# Patient Record
Sex: Male | Born: 1960
Health system: Southern US, Community
[De-identification: ages and names within clinical notes are randomized; demographics above are authoritative.]

## PROBLEM LIST (undated history)

## (undated) DIAGNOSIS — E785 Hyperlipidemia, unspecified: Secondary | ICD-10-CM

## (undated) DIAGNOSIS — Z8719 Personal history of other diseases of the digestive system: Secondary | ICD-10-CM

## (undated) DIAGNOSIS — K5792 Diverticulitis of intestine, part unspecified, without perforation or abscess without bleeding: Secondary | ICD-10-CM

## (undated) DIAGNOSIS — Z9119 Patient's noncompliance with other medical treatment and regimen: Secondary | ICD-10-CM

## (undated) DIAGNOSIS — I10 Essential (primary) hypertension: Secondary | ICD-10-CM

## (undated) DIAGNOSIS — Z91199 Patient's noncompliance with other medical treatment and regimen due to unspecified reason: Secondary | ICD-10-CM

## (undated) HISTORY — DX: Patient's noncompliance with other medical treatment and regimen: Z91.19

## (undated) HISTORY — DX: Diverticulitis of intestine, part unspecified, without perforation or abscess without bleeding: K57.92

## (undated) HISTORY — DX: Hyperlipidemia, unspecified: E78.5

## (undated) HISTORY — PX: COLONOSCOPY: SHX174

## (undated) HISTORY — DX: Essential (primary) hypertension: I10

## (undated) HISTORY — PX: POLYPECTOMY: SHX149

## (undated) HISTORY — PX: HERNIA REPAIR: SHX51

## (undated) HISTORY — DX: Patient's noncompliance with other medical treatment and regimen due to unspecified reason: Z91.199

## (undated) HISTORY — PX: CHOLECYSTECTOMY: SHX55

## (undated) HISTORY — DX: Personal history of other diseases of the digestive system: Z87.19

---

## 2015-12-13 DIAGNOSIS — R1031 Right lower quadrant pain: Secondary | ICD-10-CM | POA: Diagnosis not present

## 2015-12-13 DIAGNOSIS — R1084 Generalized abdominal pain: Secondary | ICD-10-CM | POA: Diagnosis not present

## 2015-12-13 DIAGNOSIS — R1032 Left lower quadrant pain: Secondary | ICD-10-CM | POA: Diagnosis not present

## 2016-04-19 DIAGNOSIS — Z0000 Encounter for general adult medical examination without abnormal findings: Secondary | ICD-10-CM | POA: Diagnosis not present

## 2016-04-19 DIAGNOSIS — I10 Essential (primary) hypertension: Secondary | ICD-10-CM | POA: Diagnosis not present

## 2016-04-19 DIAGNOSIS — E663 Overweight: Secondary | ICD-10-CM | POA: Diagnosis not present

## 2016-04-19 DIAGNOSIS — E785 Hyperlipidemia, unspecified: Secondary | ICD-10-CM | POA: Diagnosis not present

## 2016-06-28 DIAGNOSIS — M545 Low back pain: Secondary | ICD-10-CM | POA: Diagnosis not present

## 2016-06-28 DIAGNOSIS — M9901 Segmental and somatic dysfunction of cervical region: Secondary | ICD-10-CM | POA: Diagnosis not present

## 2016-06-28 DIAGNOSIS — M530 Cervicocranial syndrome: Secondary | ICD-10-CM | POA: Diagnosis not present

## 2016-06-28 DIAGNOSIS — M9902 Segmental and somatic dysfunction of thoracic region: Secondary | ICD-10-CM | POA: Diagnosis not present

## 2016-08-16 DIAGNOSIS — H52223 Regular astigmatism, bilateral: Secondary | ICD-10-CM | POA: Diagnosis not present

## 2017-04-25 DIAGNOSIS — Z1339 Encounter for screening examination for other mental health and behavioral disorders: Secondary | ICD-10-CM | POA: Diagnosis not present

## 2017-04-25 DIAGNOSIS — Z6828 Body mass index (BMI) 28.0-28.9, adult: Secondary | ICD-10-CM | POA: Diagnosis not present

## 2017-04-25 DIAGNOSIS — Z125 Encounter for screening for malignant neoplasm of prostate: Secondary | ICD-10-CM | POA: Diagnosis not present

## 2017-04-25 DIAGNOSIS — Z1331 Encounter for screening for depression: Secondary | ICD-10-CM | POA: Diagnosis not present

## 2017-04-25 DIAGNOSIS — Z0000 Encounter for general adult medical examination without abnormal findings: Secondary | ICD-10-CM | POA: Diagnosis not present

## 2017-06-20 DIAGNOSIS — Z79899 Other long term (current) drug therapy: Secondary | ICD-10-CM | POA: Diagnosis not present

## 2017-08-22 DIAGNOSIS — J111 Influenza due to unidentified influenza virus with other respiratory manifestations: Secondary | ICD-10-CM | POA: Diagnosis not present

## 2017-09-17 DIAGNOSIS — M9901 Segmental and somatic dysfunction of cervical region: Secondary | ICD-10-CM | POA: Diagnosis not present

## 2017-09-17 DIAGNOSIS — M542 Cervicalgia: Secondary | ICD-10-CM | POA: Diagnosis not present

## 2017-09-17 DIAGNOSIS — M9903 Segmental and somatic dysfunction of lumbar region: Secondary | ICD-10-CM | POA: Diagnosis not present

## 2017-09-17 DIAGNOSIS — M545 Low back pain: Secondary | ICD-10-CM | POA: Diagnosis not present

## 2017-09-23 DIAGNOSIS — J029 Acute pharyngitis, unspecified: Secondary | ICD-10-CM | POA: Diagnosis not present

## 2018-04-06 DIAGNOSIS — M9903 Segmental and somatic dysfunction of lumbar region: Secondary | ICD-10-CM | POA: Diagnosis not present

## 2018-04-06 DIAGNOSIS — M542 Cervicalgia: Secondary | ICD-10-CM | POA: Diagnosis not present

## 2018-04-06 DIAGNOSIS — M9901 Segmental and somatic dysfunction of cervical region: Secondary | ICD-10-CM | POA: Diagnosis not present

## 2018-04-06 DIAGNOSIS — M545 Low back pain: Secondary | ICD-10-CM | POA: Diagnosis not present

## 2018-04-10 DIAGNOSIS — M9903 Segmental and somatic dysfunction of lumbar region: Secondary | ICD-10-CM | POA: Diagnosis not present

## 2018-04-10 DIAGNOSIS — M545 Low back pain: Secondary | ICD-10-CM | POA: Diagnosis not present

## 2018-04-10 DIAGNOSIS — M9901 Segmental and somatic dysfunction of cervical region: Secondary | ICD-10-CM | POA: Diagnosis not present

## 2018-04-10 DIAGNOSIS — M542 Cervicalgia: Secondary | ICD-10-CM | POA: Diagnosis not present

## 2018-04-13 DIAGNOSIS — M9901 Segmental and somatic dysfunction of cervical region: Secondary | ICD-10-CM | POA: Diagnosis not present

## 2018-04-13 DIAGNOSIS — M542 Cervicalgia: Secondary | ICD-10-CM | POA: Diagnosis not present

## 2018-04-13 DIAGNOSIS — M545 Low back pain: Secondary | ICD-10-CM | POA: Diagnosis not present

## 2018-04-13 DIAGNOSIS — M9903 Segmental and somatic dysfunction of lumbar region: Secondary | ICD-10-CM | POA: Diagnosis not present

## 2018-04-15 DIAGNOSIS — M9901 Segmental and somatic dysfunction of cervical region: Secondary | ICD-10-CM | POA: Diagnosis not present

## 2018-04-15 DIAGNOSIS — M9903 Segmental and somatic dysfunction of lumbar region: Secondary | ICD-10-CM | POA: Diagnosis not present

## 2018-04-15 DIAGNOSIS — M545 Low back pain: Secondary | ICD-10-CM | POA: Diagnosis not present

## 2018-04-15 DIAGNOSIS — M542 Cervicalgia: Secondary | ICD-10-CM | POA: Diagnosis not present

## 2018-04-24 DIAGNOSIS — K5792 Diverticulitis of intestine, part unspecified, without perforation or abscess without bleeding: Secondary | ICD-10-CM | POA: Diagnosis not present

## 2018-04-24 DIAGNOSIS — Z1211 Encounter for screening for malignant neoplasm of colon: Secondary | ICD-10-CM | POA: Diagnosis not present

## 2018-04-24 DIAGNOSIS — Z13220 Encounter for screening for lipoid disorders: Secondary | ICD-10-CM | POA: Diagnosis not present

## 2018-04-24 DIAGNOSIS — Z0000 Encounter for general adult medical examination without abnormal findings: Secondary | ICD-10-CM | POA: Diagnosis not present

## 2018-04-24 DIAGNOSIS — Z6829 Body mass index (BMI) 29.0-29.9, adult: Secondary | ICD-10-CM | POA: Diagnosis not present

## 2018-05-12 DIAGNOSIS — J029 Acute pharyngitis, unspecified: Secondary | ICD-10-CM | POA: Diagnosis not present

## 2018-05-12 DIAGNOSIS — R1032 Left lower quadrant pain: Secondary | ICD-10-CM | POA: Diagnosis not present

## 2018-05-12 DIAGNOSIS — K5732 Diverticulitis of large intestine without perforation or abscess without bleeding: Secondary | ICD-10-CM | POA: Diagnosis not present

## 2018-05-12 DIAGNOSIS — R112 Nausea with vomiting, unspecified: Secondary | ICD-10-CM | POA: Diagnosis not present

## 2018-05-12 DIAGNOSIS — R6889 Other general symptoms and signs: Secondary | ICD-10-CM | POA: Diagnosis not present

## 2018-06-02 ENCOUNTER — Encounter: Payer: Self-pay | Admitting: Gastroenterology

## 2018-06-24 ENCOUNTER — Telehealth: Payer: Self-pay

## 2018-06-24 ENCOUNTER — Ambulatory Visit (AMBULATORY_SURGERY_CENTER): Payer: Self-pay | Admitting: *Deleted

## 2018-06-24 VITALS — Ht 72.0 in | Wt 217.0 lb

## 2018-06-24 DIAGNOSIS — Z86010 Personal history of colonic polyps: Secondary | ICD-10-CM

## 2018-06-24 MED ORDER — SOD PICOSULFATE-MAG OX-CIT ACD 10-3.5-12 MG-GM -GM/160ML PO SOLN: 1.0000 | ORAL | 0 refills | Status: DC

## 2018-06-24 NOTE — Progress Notes (Signed)
Patient denies any allergies to eggs or soy. Patient denies any problems with anesthesia/sedation. Patient denies any oxygen use at home. Patient denies taking any diet/weight loss medications or blood thinners. EMMI education offered, pt declined. Clenpiq free coupon given to pt. Pt denies any kidney problems.

## 2018-06-24 NOTE — Telephone Encounter (Signed)
-----   Message from Mohammed Kindle, RN sent at 06/24/2018  3:55 PM EST ----- Regarding: FW: Donald Frank Please set this patient up with a pre visit and a procedure date/time; ----- Message ----- From: Jackquline Denmark, MD Sent: 06/24/2018  12:17 PM EST To: Donald Peace, RN, Mohammed Kindle, RN Subject: RE: Donald Frank                           Hi Donald Frank  Have reviewed the colonoscopy report-: 03/08/2013-1.2 cm polyp, Two 6 mm polyps. Bx-tubular adenomas.  Was due for colonoscopy 02/2016.  Was sent letter.   Plan: -Proceed with colonoscopy with 2-day preparation. Bre to set him up.  Merrie Roof ----- Message ----- From: Donald Peace, RN Sent: 05/29/2018   1:13 PM EST To: Jackquline Denmark, MD Subject: Donald Frank                               Dr. Lyndel Safe,  I am friends with Donald Frank and his wife.  He is your patient.  He has had several colonoscopies with you d/t recurrent diverticulitis.  His DOB is 05-30-2060.  His wife reached out to me and said he is due for another colonoscopy.  He had a recent diverticulitis attack.  Would you like an OV with him or just a direct colonoscopy?  Thanks, [employer]

## 2018-06-24 NOTE — Telephone Encounter (Signed)
Left message for patient to call back to office ?

## 2018-06-25 ENCOUNTER — Encounter: Payer: Self-pay | Admitting: Gastroenterology

## 2018-06-26 NOTE — Telephone Encounter (Signed)
Patient has been seen in pre visit on 06/24/2018 appt at 3:30pm; patient has also been scheduled on 06/29/2018 appt at 9:30am for prop colon;

## 2018-06-29 ENCOUNTER — Encounter: Payer: Self-pay | Admitting: Gastroenterology

## 2018-06-29 ENCOUNTER — Ambulatory Visit (AMBULATORY_SURGERY_CENTER): Payer: BLUE CROSS/BLUE SHIELD | Admitting: Gastroenterology

## 2018-06-29 ENCOUNTER — Other Ambulatory Visit: Payer: Self-pay

## 2018-06-29 VITALS — BP 117/68 | HR 56 | Temp 99.3°F | Resp 19 | Ht 72.0 in | Wt 217.0 lb

## 2018-06-29 DIAGNOSIS — D124 Benign neoplasm of descending colon: Secondary | ICD-10-CM | POA: Diagnosis not present

## 2018-06-29 DIAGNOSIS — Z860100 Personal history of colon polyps, unspecified: Secondary | ICD-10-CM

## 2018-06-29 DIAGNOSIS — D123 Benign neoplasm of transverse colon: Secondary | ICD-10-CM

## 2018-06-29 MED ORDER — SODIUM CHLORIDE 0.9 % IV SOLN: 500.0000 mL | Freq: Once | INTRAVENOUS | Status: DC

## 2018-06-29 NOTE — Progress Notes (Signed)
Called to room to assist during endoscopic procedure.  Patient ID and intended procedure confirmed with present staff. Received instructions for my participation in the procedure from the performing physician.  

## 2018-06-29 NOTE — Patient Instructions (Signed)
Handouts given on polyps, diverticulosis and hemorrhoids.  Also high fiber diet. Absolutely no driving today. Start with light meal such as eggs, grits, toast, pancakes/waffles, or lean meat.    YOU HAD AN ENDOSCOPIC PROCEDURE TODAY AT [facility]:   Refer to the procedure report that was given to you for any specific questions about what was found during the examination.  If the procedure report does not answer your questions, please call your gastroenterologist to clarify.  If you requested that your care partner not be given the details of your procedure findings, then the procedure report has been included in a sealed envelope for you to review at your convenience later.  YOU SHOULD EXPECT: Some feelings of bloating in the abdomen. Passage of more gas than usual.  Walking can help get rid of the air that was put into your GI tract during the procedure and reduce the bloating. If you had a lower endoscopy (such as a colonoscopy or flexible sigmoidoscopy) you may notice spotting of blood in your stool or on the toilet paper. If you underwent a bowel prep for your procedure, you may not have a normal bowel movement for a few days.  Please Note:  You might notice some irritation and congestion in your nose or some drainage.  This is from the oxygen used during your procedure.  There is no need for concern and it should clear up in a day or so.  SYMPTOMS TO REPORT IMMEDIATELY:   Following lower endoscopy (colonoscopy or flexible sigmoidoscopy):  Excessive amounts of blood in the stool  Significant tenderness or worsening of abdominal pains  Swelling of the abdomen that is new, acute  Fever of 100F or higher   For urgent or emergent issues, a gastroenterologist can be reached at any hour by calling [phone].   DIET:  We do recommend a small meal at first, but then you may proceed to your regular diet.  Drink plenty of fluids but you should avoid alcoholic beverages for  24 hours.  ACTIVITY:  You should plan to take it easy for the rest of today and you should NOT DRIVE or use heavy machinery until tomorrow (because of the sedation medicines used during the test).    FOLLOW UP: Our staff will call the number listed on your records the next business day following your procedure to check on you and address any questions or concerns that you may have regarding the information given to you following your procedure. If we do not reach you, we will leave a message.  However, if you are feeling well and you are not experiencing any problems, there is no need to return our call.  We will assume that you have returned to your regular daily activities without incident.  If any biopsies were taken you will be contacted by phone or by letter within the next 1-3 weeks.  Please call us at [phone] if you have not heard about the biopsies in 3 weeks.    SIGNATURES/CONFIDENTIALITY: You and/or your care partner have signed paperwork which will be entered into your electronic medical record.  These signatures attest to the fact that that the information above on your After Visit Summary has been reviewed and is understood.  Full responsibility of the confidentiality of this discharge information lies with you and/or your care-partner.

## 2018-06-29 NOTE — Op Note (Signed)
[place] Patient Name: Donald Frank Procedure Date: 06/29/2018 9:24 AM MRN: [id number] Endoscopist: Jackquline Denmark , MD Age: 58 Referring MD:  Date of Birth: 1961/02/20 Gender: Male Account #: [id number] Procedure:                Colonoscopy Indications:              High risk colon cancer surveillance: Personal                            history of colonic polyps Medicines:                Monitored Anesthesia Care Procedure:                Pre-Anesthesia Assessment:                           - Prior to the procedure, a History and Physical                            was performed, and patient medications and                            allergies were reviewed. The patient's tolerance of                            previous anesthesia was also reviewed. The risks                            and benefits of the procedure and the sedation                            options and risks were discussed with the patient.                            All questions were answered, and informed consent                            was obtained. Prior Anticoagulants: The patient has                            taken no previous anticoagulant or antiplatelet                            agents. ASA Grade Assessment: II - A patient with                            mild systemic disease. After reviewing the risks                            and benefits, the patient was deemed in                            satisfactory condition to undergo the procedure.  After obtaining informed consent, the colonoscope                            was passed under direct vision. Throughout the                            procedure, the patient's blood pressure, pulse, and                            oxygen saturations were monitored continuously. The                            Colonoscope was introduced through the anus and                            advanced to the 2 cm into the ileum. The                             colonoscopy was performed without difficulty. The                            patient tolerated the procedure well. The quality                            of the bowel preparation was excellent. The                            terminal ileum, ileocecal valve, appendiceal                            orifice, and rectum were photographed. Scope In: 9:29:40 AM Scope Out: 9:47:02 AM Scope Withdrawal Time: 0 hours 11 minutes 51 seconds  Total Procedure Duration: 0 hours 17 minutes 22 seconds  Findings:                 Two sessile polyps were found in the proximal                            descending colon and mid transverse colon. The                            polyps were 2 to 6 mm in size. These polyps were                            removed with a cold snare. Resection and retrieval                            were complete. Estimated blood loss: none.                           Multiple small-mouthed diverticula were found in                            the sigmoid colon, descending colon  and few                            scattered diverticula in ascending colon and                            transverse colon.                           Non-bleeding internal hemorrhoids were found during                            retroflexion. The hemorrhoids were small.                           The exam was otherwise without abnormality on                            direct and retroflexion views.                           The terminal ileum appeared normal. Complications:            No immediate complications. Estimated Blood Loss:     Estimated blood loss: none. Impression:               -Colonic polyps status post polypectomy.                           -Pancolonic diverticulosis predominantly in the                            left colon.                           -Otherwise normal colonoscopy to TI. Recommendation:           - Patient has a contact number available for                             emergencies. The signs and symptoms of potential                            delayed complications were discussed with the                            patient. Return to normal activities tomorrow.                            Written discharge instructions were provided to the                            patient.                           - High fiber diet.                           - Continue  present medications.                           - Await pathology results.                           - Repeat colonoscopy for surveillance based on                            pathology results.                           - Return to GI office PRN. Jackquline Denmark, MD 06/29/2018 9:54:00 AM This report has been signed electronically.

## 2018-06-29 NOTE — Progress Notes (Signed)
PT taken to PACU. Monitors in place. VSS. Report given to RN. 

## 2018-06-30 ENCOUNTER — Telehealth: Payer: Self-pay | Admitting: *Deleted

## 2018-06-30 ENCOUNTER — Telehealth: Payer: Self-pay

## 2018-06-30 NOTE — Telephone Encounter (Signed)
  Follow up Call-  Call back number 06/29/2018  Post procedure Call Back phone  # [phone]  Permission to leave phone message Yes     Patient questions:  Left message to call us if necessary.  Second call.

## 2018-06-30 NOTE — Telephone Encounter (Signed)
Follow up call made, name identifier left a message.

## 2018-07-05 ENCOUNTER — Encounter: Payer: Self-pay | Admitting: Gastroenterology

## 2019-04-08 DIAGNOSIS — H524 Presbyopia: Secondary | ICD-10-CM | POA: Diagnosis not present

## 2019-04-28 DIAGNOSIS — Z13220 Encounter for screening for lipoid disorders: Secondary | ICD-10-CM | POA: Diagnosis not present

## 2019-04-28 DIAGNOSIS — Z0000 Encounter for general adult medical examination without abnormal findings: Secondary | ICD-10-CM | POA: Diagnosis not present

## 2019-04-28 DIAGNOSIS — Z131 Encounter for screening for diabetes mellitus: Secondary | ICD-10-CM | POA: Diagnosis not present

## 2019-04-28 DIAGNOSIS — Z125 Encounter for screening for malignant neoplasm of prostate: Secondary | ICD-10-CM | POA: Diagnosis not present

## 2019-04-28 DIAGNOSIS — G4700 Insomnia, unspecified: Secondary | ICD-10-CM | POA: Diagnosis not present

## 2019-04-28 DIAGNOSIS — Z6829 Body mass index (BMI) 29.0-29.9, adult: Secondary | ICD-10-CM | POA: Diagnosis not present

## 2019-04-28 DIAGNOSIS — Z1331 Encounter for screening for depression: Secondary | ICD-10-CM | POA: Diagnosis not present

## 2019-07-01 DIAGNOSIS — I10 Essential (primary) hypertension: Secondary | ICD-10-CM | POA: Diagnosis not present

## 2019-07-01 DIAGNOSIS — G4700 Insomnia, unspecified: Secondary | ICD-10-CM | POA: Diagnosis not present

## 2019-07-01 DIAGNOSIS — E782 Mixed hyperlipidemia: Secondary | ICD-10-CM | POA: Diagnosis not present

## 2019-07-01 DIAGNOSIS — R400 Somnolence: Secondary | ICD-10-CM | POA: Diagnosis not present

## 2019-07-23 DIAGNOSIS — G4733 Obstructive sleep apnea (adult) (pediatric): Secondary | ICD-10-CM | POA: Diagnosis not present

## 2019-07-23 DIAGNOSIS — R5383 Other fatigue: Secondary | ICD-10-CM | POA: Diagnosis not present

## 2019-07-23 DIAGNOSIS — R911 Solitary pulmonary nodule: Secondary | ICD-10-CM | POA: Diagnosis not present

## 2019-07-31 DIAGNOSIS — M10072 Idiopathic gout, left ankle and foot: Secondary | ICD-10-CM | POA: Diagnosis not present

## 2019-08-18 DIAGNOSIS — G4733 Obstructive sleep apnea (adult) (pediatric): Secondary | ICD-10-CM | POA: Diagnosis not present

## 2020-05-04 DIAGNOSIS — K5792 Diverticulitis of intestine, part unspecified, without perforation or abscess without bleeding: Secondary | ICD-10-CM | POA: Insufficient documentation

## 2020-05-04 DIAGNOSIS — E785 Hyperlipidemia, unspecified: Secondary | ICD-10-CM | POA: Insufficient documentation

## 2020-05-04 DIAGNOSIS — Z8719 Personal history of other diseases of the digestive system: Secondary | ICD-10-CM | POA: Insufficient documentation

## 2020-05-04 DIAGNOSIS — Z9119 Patient's noncompliance with other medical treatment and regimen: Secondary | ICD-10-CM | POA: Insufficient documentation

## 2020-05-04 DIAGNOSIS — I10 Essential (primary) hypertension: Secondary | ICD-10-CM | POA: Insufficient documentation

## 2020-05-04 DIAGNOSIS — Z91199 Patient's noncompliance with other medical treatment and regimen due to unspecified reason: Secondary | ICD-10-CM | POA: Insufficient documentation

## 2020-05-08 ENCOUNTER — Ambulatory Visit (INDEPENDENT_AMBULATORY_CARE_PROVIDER_SITE_OTHER): Payer: 59 | Admitting: Cardiology

## 2020-05-08 ENCOUNTER — Encounter: Payer: Self-pay | Admitting: Cardiology

## 2020-05-08 ENCOUNTER — Other Ambulatory Visit: Payer: Self-pay

## 2020-05-08 VITALS — BP 120/80 | HR 59 | Ht 72.0 in | Wt 220.4 lb

## 2020-05-08 DIAGNOSIS — E781 Pure hyperglyceridemia: Secondary | ICD-10-CM

## 2020-05-08 DIAGNOSIS — E7801 Familial hypercholesterolemia: Secondary | ICD-10-CM

## 2020-05-08 DIAGNOSIS — I10 Essential (primary) hypertension: Secondary | ICD-10-CM | POA: Diagnosis not present

## 2020-05-08 DIAGNOSIS — I2510 Atherosclerotic heart disease of native coronary artery without angina pectoris: Secondary | ICD-10-CM | POA: Diagnosis not present

## 2020-05-08 MED ORDER — VASCEPA 0.5 G PO CAPS: 0.5000 g | ORAL_CAPSULE | Freq: Every day | ORAL | 3 refills | Status: DC

## 2020-05-08 MED ORDER — ROSUVASTATIN CALCIUM 40 MG PO TABS: 40.0000 mg | ORAL_TABLET | Freq: Every day | ORAL | 3 refills | Status: DC

## 2020-05-08 NOTE — Progress Notes (Signed)
Cardiology Office Note:    Date:  05/09/2020   ID:  Donald Frank, DOB 1960-07-08, MRN [id number]  PCP:  Angelina Sheriff, MD  Cardiologist:  Berniece Salines, DO  Electrophysiologist:  None   Referring MD: Darien Ramus, MD   " I am doing well"  History of Present Illness:    Donald Frank is a 60 y.o. male with a hx of dyslipidemia, hypertension is here today to be evaluated for familial hyperlipidemia.  The patient follows with Dr. Jeryl Columbia and his recent lipid profile which was done April 25, 2020 showed significantly elevated triglycerides 864, total cholesterol 357, LDL 55 and HDL 25.  He was asked to see cardiology for help with management of his hyperlipidemia.  He denies any chest pain, shortness of breath, lightheadedness, dizziness  Past Medical History:  Diagnosis Date  . Diverticulitis 05/12/2018=treatment done  . Dyslipidemia   . History of pancreatitis   . Hyperlipidemia   . Hypertension   . Noncompliance     Past Surgical History:  Procedure Laterality Date  . CHOLECYSTECTOMY    . COLONOSCOPY  last 02/23/2009  . HERNIA REPAIR    . POLYPECTOMY      Current Medications: Current Meds  Medication Sig  . Icosapent Ethyl (VASCEPA) 0.5 g CAPS Take 0.5 g by mouth daily.  Marland Kitchen lisinopril (PRINIVIL,ZESTRIL) 5 MG tablet   . rosuvastatin (CRESTOR) 40 MG tablet Take 1 tablet (40 mg total) by mouth daily.  . [DISCONTINUED] fenofibrate (TRICOR) 145 MG tablet   . [DISCONTINUED] rosuvastatin (CRESTOR) 20 MG tablet      Allergies:   Lipitor [atorvastatin]   Social History   Socioeconomic History  . Marital status: Married    Spouse name: Not on file  . Number of children: Not on file  . Years of education: Not on file  . Highest education level: Not on file  Occupational History  . Not on file  Tobacco Use  . Smoking status: Never Smoker  . Smokeless tobacco: Never Used  Vaping Use  . Vaping Use: Never used  Substance and Sexual Activity  .  Alcohol use: Yes    Alcohol/week: 3.0 standard drinks    Types: 3 Standard drinks or equivalent per week    Comment: per week  . Drug use: Not Currently  . Sexual activity: Not on file  Other Topics Concern  . Not on file  Social History Narrative  . Not on file   Social Determinants of Health   Financial Resource Strain: Not on file  Food Insecurity: Not on file  Transportation Needs: Not on file  Physical Activity: Not on file  Stress: Not on file  Social Connections: Not on file     Family History: The patient's family history includes Heart Problems in his mother. There is no history of Colon cancer, Colon polyps, Esophageal cancer, Rectal cancer, or Stomach cancer.  ROS:   Review of Systems  Constitution: Negative for decreased appetite, fever and weight gain.  HENT: Negative for congestion, ear discharge, hoarse voice and sore throat.   Eyes: Negative for discharge, redness, vision loss in right eye and visual halos.  Cardiovascular: Negative for chest pain, dyspnea on exertion, leg swelling, orthopnea and palpitations.  Respiratory: Negative for cough, hemoptysis, shortness of breath and snoring.   Endocrine: Negative for heat intolerance and polyphagia.  Hematologic/Lymphatic: Negative for bleeding problem. Does not bruise/bleed easily.  Skin: Negative for flushing, nail changes, rash and suspicious lesions.  Musculoskeletal:  Negative for arthritis, joint pain, muscle cramps, myalgias, neck pain and stiffness.  Gastrointestinal: Negative for abdominal pain, bowel incontinence, diarrhea and excessive appetite.  Genitourinary: Negative for decreased libido, genital sores and incomplete emptying.  Neurological: Negative for brief paralysis, focal weakness, headaches and loss of balance.  Psychiatric/Behavioral: Negative for altered mental status, depression and suicidal ideas.  Allergic/Immunologic: Negative for HIV exposure and persistent infections.    EKGs/Labs/Other  Studies Reviewed:    The following studies were reviewed today:   EKG:  The ekg ordered today demonstrates sinus rhythm, heart rate 60 bpm.  Recent Labs: No results found for requested labs within last 8760 hours.  Recent Lipid Panel No results found for: CHOL, TRIG, HDL, CHOLHDL, VLDL, LDLCALC, LDLDIRECT  Physical Exam:    VS:  BP 120/80 (BP Location: Left Arm)   Pulse (!) 59   Ht 6' (1.829 m)   Wt 220 lb 6.4 oz (100 kg)   SpO2 98%   BMI 29.89 kg/m     Wt Readings from Last 3 Encounters:  05/08/20 220 lb 6.4 oz (100 kg)  06/29/18 217 lb (98.4 kg)  06/24/18 217 lb (98.4 kg)     GEN: Well nourished, well developed in no acute distress HEENT: Normal NECK: No JVD; No carotid bruits LYMPHATICS: No lymphadenopathy CARDIAC: S1S2 noted,RRR, no murmurs, rubs, gallops RESPIRATORY:  Clear to auscultation without rales, wheezing or rhonchi  ABDOMEN: Soft, non-tender, non-distended, +bowel sounds, no guarding. EXTREMITIES: No edema, No cyanosis, no clubbing MUSCULOSKELETAL:  No deformity  SKIN: Warm and dry NEUROLOGIC:  Alert and oriented x 3, non-focal PSYCHIATRIC:  Normal affect, good insight  ASSESSMENT:    1. Hypertension, unspecified type   2. ASCVD (arteriosclerotic cardiovascular disease)   3. Familial hypercholesterolemia   4. Hypertriglyceridemia, familial    PLAN:     He is currently on Crestor 20 mg daily along with fenofibrate 145 mg.  I reviewed his lipid profile triglycerides 864 would like to do is stop the fenofibrate and put the patient on Vascepa starting with 500 mg daily.  Also increase his Crestor to 40 mg daily.  LP(a) today.  He may be a great candidate for PCSK9 inhibitors but what I like to do first is repeat his lipid profile in 6 weeks after the change and if this is still elevated I recommend him to our lipid clinic to start the process for his PCSK9 inhibitors.  In the meantime we have calculated his ASCVD risk score and at this time would like to  have the patient undergo coronary artery calcium scoring.  We talked about the testings.  All of his questions has been answered.  He is agreeable to proceed with this.  Prior to his next visit he will get a lipoprotein a test, lipid panel as well as hepatic function testing.  In addition we also talked about diet modification which will help as well.   The patient is in agreement with the above plan. The patient left the office in stable condition.  The patient will follow up in 7 weeks or sooner if needed.   Medication Adjustments/Labs and Tests Ordered: Current medicines are reviewed at length with the patient today.  Concerns regarding medicines are outlined above.  Orders Placed This Encounter  Procedures  . CT CARDIAC SCORING (SELF PAY ONLY)  . Lipoprotein A (LPA)  . Lipid panel  . Hepatic function panel  . EKG 12-Lead   Meds ordered this encounter  Medications  . Icosapent Ethyl (  VASCEPA) 0.5 g CAPS    Sig: Take 0.5 g by mouth daily.    Dispense:  90 capsule    Refill:  3  . rosuvastatin (CRESTOR) 40 MG tablet    Sig: Take 1 tablet (40 mg total) by mouth daily.    Dispense:  90 tablet    Refill:  3    Patient Instructions  Medication Instructions:  Your physician has recommended you make the following change in your medication:  STOP: Fenofibrate START: Crestor 40 mg take one tablet by mouth daily.  START: Vascepa 0.5 g take one tablet by mouth daily.  *If you need a refill on your cardiac medications before your next appointment, please call your pharmacy*   Lab Work: Your physician recommends that you return for lab work in: TODAY: Lpa  In 6 weeks: LFT'S, Lipids If you have labs (blood work) drawn today and your tests are completely normal, you will receive your results only by: Marland Kitchen MyChart Message (if you have MyChart) OR . A paper copy in the mail If you have any lab test that is abnormal or we need to change your treatment, we will call you to review the  results.   Testing/Procedures: We have put in the order for you to have a CT calcium score completed. They will call you to schedule this appointment.    Follow-Up: At [facility], you and your health needs are our priority.  As part of our continuing mission to provide you with exceptional heart care, we have created designated Provider Care Teams.  These Care Teams include your primary Cardiologist (physician) and Advanced Practice Providers (APPs -  Physician Assistants and Nurse Practitioners) who all work together to provide you with the care you need, when you need it.  We recommend signing up for the patient portal called "MyChart".  Sign up information is provided on this After Visit Summary.  MyChart is used to connect with patients for Virtual Visits (Telemedicine).  Patients are able to view lab/test results, encounter notes, upcoming appointments, etc.  Non-urgent messages can be sent to your provider as well.   To learn more about what you can do with MyChart, go to [email or web address].    Your next appointment:   7 week(s)  The format for your next appointment:   In Person  Provider:   Berniece Salines, DO   Other Instructions      Adopting a Healthy Lifestyle.  Know what a healthy weight is for you (roughly BMI <25) and aim to maintain this   Aim for 7+ servings of fruits and vegetables daily   65-80+ fluid ounces of water or unsweet tea for healthy kidneys   Limit to max 1 drink of alcohol per day; avoid smoking/tobacco   Limit animal fats in diet for cholesterol and heart health - choose grass fed whenever available   Avoid highly processed foods, and foods high in saturated/trans fats   Aim for low stress - take time to unwind and care for your mental health   Aim for 150 min of moderate intensity exercise weekly for heart health, and weights twice weekly for bone health   Aim for 7-9 hours of sleep daily   When it comes to diets, agreement about  the perfect plan isnt easy to find, even among the experts. Experts at the [employer] developed an idea known as the Healthy Eating Plate. Just imagine a plate divided into logical, healthy portions.   The  emphasis is on diet quality:   Load up on vegetables and fruits - one-half of your plate: Aim for color and variety, and remember that potatoes dont count.   Go for whole grains - one-quarter of your plate: Whole wheat, barley, wheat berries, quinoa, oats, brown rice, and foods made with them. If you want pasta, go with whole wheat pasta.   Protein power - one-quarter of your plate: Fish, chicken, beans, and nuts are all healthy, versatile protein sources. Limit red meat.   The diet, however, does go beyond the plate, offering a few other suggestions.   Use healthy plant oils, such as olive, canola, soy, corn, sunflower and peanut. Check the labels, and avoid partially hydrogenated oil, which have unhealthy trans fats.   If youre thirsty, drink water. Coffee and tea are good in moderation, but skip sugary drinks and limit milk and dairy products to one or two daily servings.   The type of carbohydrate in the diet is more important than the amount. Some sources of carbohydrates, such as vegetables, fruits, whole grains, and beans-are healthier than others.   Finally, stay active  Signed, Berniece Salines, DO  05/09/2020 8:14 AM    [place]

## 2020-05-08 NOTE — Patient Instructions (Signed)
Medication Instructions:  Your physician has recommended you make the following change in your medication:  STOP: Fenofibrate START: Crestor 40 mg take one tablet by mouth daily.  START: Vascepa 0.5 g take one tablet by mouth daily.  *If you need a refill on your cardiac medications before your next appointment, please call your pharmacy*   Lab Work: Your physician recommends that you return for lab work in: TODAY: Lpa  In 6 weeks: LFT'S, Lipids If you have labs (blood work) drawn today and your tests are completely normal, you will receive your results only by: Marland Kitchen MyChart Message (if you have MyChart) OR . A paper copy in the mail If you have any lab test that is abnormal or we need to change your treatment, we will call you to review the results.   Testing/Procedures: We have put in the order for you to have a CT calcium score completed. They will call you to schedule this appointment.    Follow-Up: At [facility], you and your health needs are our priority.  As part of our continuing mission to provide you with exceptional heart care, we have created designated Provider Care Teams.  These Care Teams include your primary Cardiologist (physician) and Advanced Practice Providers (APPs -  Physician Assistants and Nurse Practitioners) who all work together to provide you with the care you need, when you need it.  We recommend signing up for the patient portal called "MyChart".  Sign up information is provided on this After Visit Summary.  MyChart is used to connect with patients for Virtual Visits (Telemedicine).  Patients are able to view lab/test results, encounter notes, upcoming appointments, etc.  Non-urgent messages can be sent to your provider as well.   To learn more about what you can do with MyChart, go to [email or web address].    Your next appointment:   7 week(s)  The format for your next appointment:   In Person  Provider:   Berniece Salines, DO   Other  Instructions

## 2020-05-09 ENCOUNTER — Telehealth: Payer: Self-pay

## 2020-05-09 DIAGNOSIS — E785 Hyperlipidemia, unspecified: Secondary | ICD-10-CM

## 2020-05-09 LAB — LIPOPROTEIN A (LPA): Lipoprotein (a): 224.9 nmol/L — ABNORMAL HIGH (ref <75.0)

## 2020-05-09 NOTE — Telephone Encounter (Signed)
-----   Message from Berniece Salines, DO sent at 05/09/2020  3:04 PM EST ----- Let the patient know that his Lpa is elevated. I would like to send the referral for the lipid clinic evaluation for PCSK9 inhibitor.

## 2020-05-09 NOTE — Telephone Encounter (Signed)
Spoke with patient regarding results and recommendation.  Patient verbalizes understanding and is agreeable to plan of care. Advised patient to call back with any issues or concerns.  

## 2020-05-22 ENCOUNTER — Encounter: Payer: Self-pay | Admitting: Pharmacist

## 2020-05-22 ENCOUNTER — Other Ambulatory Visit: Payer: Self-pay

## 2020-05-22 ENCOUNTER — Ambulatory Visit (INDEPENDENT_AMBULATORY_CARE_PROVIDER_SITE_OTHER): Payer: 59 | Admitting: Pharmacist

## 2020-05-22 DIAGNOSIS — E782 Mixed hyperlipidemia: Secondary | ICD-10-CM | POA: Diagnosis not present

## 2020-05-22 DIAGNOSIS — E7801 Familial hypercholesterolemia: Secondary | ICD-10-CM

## 2020-05-22 MED ORDER — VASCEPA 0.5 G PO CAPS: ORAL_CAPSULE | ORAL | 3 refills | Status: DC

## 2020-05-22 MED ORDER — EVOLOCUMAB 140 MG/ML ~~LOC~~ SOAJ: 1.0000 mL | SUBCUTANEOUS | 1 refills | Status: DC

## 2020-05-22 NOTE — Patient Instructions (Addendum)
It was nice meeting you today!  We would like your LDL (bad cholesterol) to be less than 70 We would like your triglycerides to be less than 150  Please continue your rosuvastatin 40mg  once daily  Increase your Vascepa to 4 capsules twice daily  We are going to start a new medication called Repatha which you will inject once every 2 weeks  Try to increase your physical activity to at least 30 minutes a day at least 5 days a week  Try to concentrate on eating vegetables, lean proteins, whole grains, and healthy fats  Avoid trans fats and partially hydrogenated oils  Please call with any questions!  Karren Cobble, PharmD, BCACP, [place], [place] [address] [place], [place] [address] Phone: [phone]; Fax: [phone] 05/22/2020 4:09 PM

## 2020-05-22 NOTE — Progress Notes (Signed)
Patient ID: Donald Frank                 DOB: [birth year]                    MRN: [id number]     HPI: Donald Frank is a 60 y.o. male patient referred to lipid clinic by Dr Harriet Masson. PMH is significant for HTN, HLD, and a history of pancreatitis ten years ago.  Referred by PCP to [facility] after lipid panel came back elevated.  LDL could not be calculated due to triglycerides.  At visit with Dr. Harriet Masson on 05/09/20, fenofibrate was discontinued and Vascepa 0.5g was initiated and rosuvastatin was increased to 40mg .  A LP(a) was ordered which came back at 224.9 and patient was referred to lipid clinic.   Patient presents today in good spirits.  Reports his triglycerides and cholesterol have always been high usually between the 300s and 800s.  He does not know of any significant family history of dyslipidemia.  Drinks alcohol socially on Fridays or when hes playing golf.  Does not golf as much during the colder months. Reports he does not eat much processed food. Occasionally will eat fast food if he is on the road.  Prefers grilling his own food ([place], fish, chicken) and his wife cooks vegetables.    Since rosuvastatin increase has noticed muscle pain in his upper arms which comes and goes but he is tolerating it.  Took his pharmacy a week to order his Vascepa and his copay was 100 dollars.    Works in [employer]  . Current Medications: Vascepa 0.5g daily, rosuvastatin 40mg   Intolerances: atorvastatin (muscle pain) LDL goal: <70  Diet: grills a lot, [place], fish, chicken, pork.  vegetabkles   Family History: unknown  Social History: rarely alcohol, no tobacco.  Labs: LDL 55  (06/20/17), Trigs (04/28/19), HDL 25 (04/25/20) TC 357 (04/25/20) lipoprotein a 224.9 (05/08/20) while on fenofibrate 145 and rosuvastatin 20  Past Medical History:  Diagnosis Date  . Diverticulitis 05/12/2018=treatment done  . Dyslipidemia   . History of pancreatitis   . Hyperlipidemia   . Hypertension   .  Noncompliance     Current Outpatient Medications on File Prior to Visit  Medication Sig Dispense Refill  . Icosapent Ethyl (VASCEPA) 0.5 g CAPS Take 0.5 g by mouth daily. 90 capsule 3  . lisinopril (PRINIVIL,ZESTRIL) 5 MG tablet     . rosuvastatin (CRESTOR) 40 MG tablet Take 1 tablet (40 mg total) by mouth daily. 90 tablet 3   No current facility-administered medications on file prior to visit.    Allergies  Allergen Reactions  . Lipitor [Atorvastatin]     Assessment/Plan:  1. Hyperlipidemia - Patient triglycerides 864 in December 2020 and LDL unknown.  Patient in general follows a healthy diet, does not have diabetes and does not drink alcohol in excess so cholesterol levels possibly familial.    Discussed diet in depth with patient and concentrated on explaining the difference between unsaturated fats, saturated fats, and trans fats and gave examples.  Recommended increasing physical activity to at least 30 minutes a day at least 5 days per week.  Patient would benefit from  Increase in Vascepa dose to 2 grams BID.  Also interested in starting Repatha.  Using demo pen, educated patient on storage, site selection, and administration.  Patient was able to demonstrate use of pen ion room.  Printed copay cards for [facility] and will complete PA.  Patient has appointment with Dr Harriet Masson in 2 weeks.  Recheck as needed  Continue rosuvastatin 40mg  ocne daily Increase Vascepa to 2grams BID Start Repatha 140mg  SQ Q 14d  Karren Cobble, PharmD, BCACP, CDCES, [place] [address] [place],  [address] Phone: [phone]; Fax: [phone] 05/22/2020 5:43 PM

## 2020-05-23 ENCOUNTER — Telehealth: Payer: Self-pay | Admitting: Pharmacist

## 2020-05-23 DIAGNOSIS — E7801 Familial hypercholesterolemia: Secondary | ICD-10-CM

## 2020-05-23 DIAGNOSIS — E782 Mixed hyperlipidemia: Secondary | ICD-10-CM

## 2020-05-23 NOTE — Telephone Encounter (Signed)
Completed PA for Repatha.  Plan requests chart notes.  Faxed notes and most recent lab to plan

## 2020-05-24 MED ORDER — ICOSAPENT ETHYL 1 G PO CAPS: 2.0000 g | ORAL_CAPSULE | Freq: Two times a day (BID) | ORAL | 3 refills | Status: DC

## 2020-05-24 MED ORDER — EVOLOCUMAB 140 MG/ML ~~LOC~~ SOAJ: 1.0000 mL | SUBCUTANEOUS | 3 refills | Status: DC

## 2020-05-24 NOTE — Addendum Note (Signed)
Addended by: Ignacio Lowder E on: 05/24/2020 08:16 AM   Modules accepted: Orders

## 2020-05-24 NOTE — Telephone Encounter (Addendum)
Prior authorization for Repatha approved through 05/23/21. Pt is aware of approval and inquired about new rx for higher dose of Vascepa. He had a 3 month supply of the 0.5g capsules filled and with dose increase has been taking 4 capsules BID, new rx not sent in yet. Advised pt that I have sent in rx for 1g dose to take 2 caps BID. Reminded him to bring his copay cards to the pharmacy for both [place]. He will have Dr Harriet Masson or PCP check f/u lipids in [place] within the next few months.

## 2020-06-26 ENCOUNTER — Encounter: Payer: Self-pay | Admitting: Cardiology

## 2020-06-26 ENCOUNTER — Ambulatory Visit: Payer: 59 | Admitting: Cardiology

## 2020-06-26 ENCOUNTER — Other Ambulatory Visit: Payer: Self-pay

## 2020-06-26 VITALS — BP 140/78 | HR 60 | Ht 72.0 in | Wt 209.2 lb

## 2020-06-26 DIAGNOSIS — E7849 Other hyperlipidemia: Secondary | ICD-10-CM

## 2020-06-26 NOTE — Progress Notes (Signed)
Cardiology Office Note:    Date:  06/26/2020   ID:  Donald Frank, DOB 21-May-1960, MRN [id number]  PCP:  Angelina Sheriff, MD  Cardiologist:  Berniece Salines, DO  Electrophysiologist:  None   Referring MD: Angelina Sheriff, MD   I am tolerating the injections  History of Present Illness:    Donald Frank is a 60 y.o. male with a hx of familial hyperlipidemia at his initial visit I increase his Crestor to 40 mg daily, transition the patient from fenofibrate to Vascepa 500 mg twice a day.  I recommended he get a LP(a) which was elevated I referred the patient to our lipid clinic for evaluation for PCSK9 inhibitors.  He did see the lipid clinic and was able to be started on PCSK9 inhibitors as well as his Vascepa was increased to 1000 mg twice a day.  I also recommend the patient to go to CT calcium scoring however he prefers to hold off.  There was some delay in starting his PCSK9 inhibitors due to some insurance related but he has had 2 doses since he started.  He is scheduled for his next dose on Tuesday.  He appears to be tolerating his medications well.  Past Medical History:  Diagnosis Date  . Diverticulitis 05/12/2018=treatment done  . Dyslipidemia   . History of pancreatitis   . Hyperlipidemia   . Hypertension   . Noncompliance     Past Surgical History:  Procedure Laterality Date  . CHOLECYSTECTOMY    . COLONOSCOPY  last 02/23/2009  . HERNIA REPAIR    . POLYPECTOMY      Current Medications: Current Meds  Medication Sig  . Evolocumab 140 MG/ML SOAJ Inject 1 mL into the skin every 14 (fourteen) days.  Marland Kitchen icosapent Ethyl (VASCEPA) 1 g capsule Take 2 capsules (2 g total) by mouth 2 (two) times daily.  Marland Kitchen lisinopril (PRINIVIL,ZESTRIL) 5 MG tablet   . rosuvastatin (CRESTOR) 40 MG tablet Take 1 tablet (40 mg total) by mouth daily.     Allergies:   Lipitor [atorvastatin]   Social History   Socioeconomic History  . Marital status: Married    Spouse name:  Not on file  . Number of children: Not on file  . Years of education: Not on file  . Highest education level: Not on file  Occupational History  . Not on file  Tobacco Use  . Smoking status: Never Smoker  . Smokeless tobacco: Never Used  Vaping Use  . Vaping Use: Never used  Substance and Sexual Activity  . Alcohol use: Yes    Alcohol/week: 3.0 standard drinks    Types: 3 Standard drinks or equivalent per week    Comment: per week  . Drug use: Not Currently  . Sexual activity: Not on file  Other Topics Concern  . Not on file  Social History Narrative  . Not on file   Social Determinants of Health   Financial Resource Strain: Not on file  Food Insecurity: Not on file  Transportation Needs: Not on file  Physical Activity: Not on file  Stress: Not on file  Social Connections: Not on file     Family History: The patient's family history includes Heart Problems in his mother. There is no history of Colon cancer, Colon polyps, Esophageal cancer, Rectal cancer, or Stomach cancer.  ROS:   Review of Systems  Constitution: Negative for decreased appetite, fever and weight gain.  HENT: Negative for congestion, ear discharge,  hoarse voice and sore throat.   Eyes: Negative for discharge, redness, vision loss in right eye and visual halos.  Cardiovascular: Negative for chest pain, dyspnea on exertion, leg swelling, orthopnea and palpitations.  Respiratory: Negative for cough, hemoptysis, shortness of breath and snoring.   Endocrine: Negative for heat intolerance and polyphagia.  Hematologic/Lymphatic: Negative for bleeding problem. Does not bruise/bleed easily.  Skin: Negative for flushing, nail changes, rash and suspicious lesions.  Musculoskeletal: Negative for arthritis, joint pain, muscle cramps, myalgias, neck pain and stiffness.  Gastrointestinal: Negative for abdominal pain, bowel incontinence, diarrhea and excessive appetite.  Genitourinary: Negative for decreased libido,  genital sores and incomplete emptying.  Neurological: Negative for brief paralysis, focal weakness, headaches and loss of balance.  Psychiatric/Behavioral: Negative for altered mental status, depression and suicidal ideas.  Allergic/Immunologic: Negative for HIV exposure and persistent infections.    EKGs/Labs/Other Studies Reviewed:    The following studies were reviewed today:   EKG: None today    Recent Labs: No results found for requested labs within last 8760 hours.  Recent Lipid Panel No results found for: CHOL, TRIG, HDL, CHOLHDL, VLDL, LDLCALC, LDLDIRECT  Physical Exam:    VS:  BP 140/78   Pulse 60   Ht 6' (1.829 m)   Wt 209 lb 3.2 oz (94.9 kg)   SpO2 98%   BMI 28.37 kg/m     Wt Readings from Last 3 Encounters:  06/26/20 209 lb 3.2 oz (94.9 kg)  05/08/20 220 lb 6.4 oz (100 kg)  06/29/18 217 lb (98.4 kg)     GEN: Well nourished, well developed in no acute distress HEENT: Normal NECK: No JVD; No carotid bruits LYMPHATICS: No lymphadenopathy CARDIAC: S1S2 noted,RRR, no murmurs, rubs, gallops RESPIRATORY:  Clear to auscultation without rales, wheezing or rhonchi  ABDOMEN: Soft, non-tender, non-distended, +bowel sounds, no guarding. EXTREMITIES: No edema, No cyanosis, no clubbing MUSCULOSKELETAL:  No deformity  SKIN: Warm and dry NEUROLOGIC:  Alert and oriented x 3, non-focal PSYCHIATRIC:  Normal affect, good insight  ASSESSMENT:    1. Familial hyperlipidemia    PLAN:     He will continue on his regimen with his PCSK9 inhibitor, Crestor 40 mg daily as well as his Vascepa 1000 mg twice a day.  He will follow with the lipid clinic as recommended.  The patient is in agreement with the above plan. The patient left the office in stable condition.  The patient will follow up in 1 year   Medication Adjustments/Labs and Tests Ordered: Current medicines are reviewed at length with the patient today.  Concerns regarding medicines are outlined above.  Orders  Placed This Encounter  Procedures  . Lipoprotein A (LPA)   No orders of the defined types were placed in this encounter.   Patient Instructions  Medication Instructions:  Your physician recommends that you continue on your current medications as directed. Please refer to the Current Medication list given to you today.  *If you need a refill on your cardiac medications before your next appointment, please call your pharmacy*   Lab Work: Your physician recommends that you return for lab work: TODAY: Lpa If you have labs (blood work) drawn today and your tests are completely normal, you will receive your results only by: Marland Kitchen MyChart Message (if you have MyChart) OR . A paper copy in the mail If you have any lab test that is abnormal or we need to change your treatment, we will call you to review the results.   Testing/Procedures:  None   Follow-Up: At [facility], you and your health needs are our priority.  As part of our continuing mission to provide you with exceptional heart care, we have created designated Provider Care Teams.  These Care Teams include your primary Cardiologist (physician) and Advanced Practice Providers (APPs -  Physician Assistants and Nurse Practitioners) who all work together to provide you with the care you need, when you need it.  We recommend signing up for the patient portal called "MyChart".  Sign up information is provided on this After Visit Summary.  MyChart is used to connect with patients for Virtual Visits (Telemedicine).  Patients are able to view lab/test results, encounter notes, upcoming appointments, etc.  Non-urgent messages can be sent to your provider as well.   To learn more about what you can do with MyChart, go to [email or web address].    Your next appointment:   1 year(s)  The format for your next appointment:   In Person  Provider:   Berniece Salines, DO   Other Instructions      Adopting a Healthy Lifestyle.  Know what a  healthy weight is for you (roughly BMI <25) and aim to maintain this   Aim for 7+ servings of fruits and vegetables daily   65-80+ fluid ounces of water or unsweet tea for healthy kidneys   Limit to max 1 drink of alcohol per day; avoid smoking/tobacco   Limit animal fats in diet for cholesterol and heart health - choose grass fed whenever available   Avoid highly processed foods, and foods high in saturated/trans fats   Aim for low stress - take time to unwind and care for your mental health   Aim for 150 min of moderate intensity exercise weekly for heart health, and weights twice weekly for bone health   Aim for 7-9 hours of sleep daily   When it comes to diets, agreement about the perfect plan isnt easy to find, even among the experts. Experts at the [employer] developed an idea known as the Healthy Eating Plate. Just imagine a plate divided into logical, healthy portions.   The emphasis is on diet quality:   Load up on vegetables and fruits - one-half of your plate: Aim for color and variety, and remember that potatoes dont count.   Go for whole grains - one-quarter of your plate: Whole wheat, barley, wheat berries, quinoa, oats, brown rice, and foods made with them. If you want pasta, go with whole wheat pasta.   Protein power - one-quarter of your plate: Fish, chicken, beans, and nuts are all healthy, versatile protein sources. Limit red meat.   The diet, however, does go beyond the plate, offering a few other suggestions.   Use healthy plant oils, such as olive, canola, soy, corn, sunflower and peanut. Check the labels, and avoid partially hydrogenated oil, which have unhealthy trans fats.   If youre thirsty, drink water. Coffee and tea are good in moderation, but skip sugary drinks and limit milk and dairy products to one or two daily servings.   The type of carbohydrate in the diet is more important than the amount. Some sources of carbohydrates,  such as vegetables, fruits, whole grains, and beans-are healthier than others.   Finally, stay active  Signed, Berniece Salines, DO  06/26/2020 3:58 PM    [facility]

## 2020-06-26 NOTE — Patient Instructions (Signed)
Medication Instructions:  Your physician recommends that you continue on your current medications as directed. Please refer to the Current Medication list given to you today.  *If you need a refill on your cardiac medications before your next appointment, please call your pharmacy*   Lab Work: Your physician recommends that you return for lab work: TODAY:  Lpa If you have labs (blood work) drawn today and your tests are completely normal, you will receive your results only by: . MyChart Message (if you have MyChart) OR . A paper copy in the mail If you have any lab test that is abnormal or we need to change your treatment, we will call you to review the results.   Testing/Procedures: None   Follow-Up: At [facility], you and your health needs are our priority.  As part of our continuing mission to provide you with exceptional heart care, we have created designated Provider Care Teams.  These Care Teams include your primary Cardiologist (physician) and Advanced Practice Providers (APPs -  Physician Assistants and Nurse Practitioners) who all work together to provide you with the care you need, when you need it.  We recommend signing up for the patient portal called "MyChart".  Sign up information is provided on this After Visit Summary.  MyChart is used to connect with patients for Virtual Visits (Telemedicine).  Patients are able to view lab/test results, encounter notes, upcoming appointments, etc.  Non-urgent messages can be sent to your provider as well.   To learn more about what you can do with MyChart, go to [email or web address].    Your next appointment:   1 year(s)  The format for your next appointment:   In Person  Provider:   Kardie Tobb, DO   Other Instructions   

## 2020-06-27 LAB — LIPOPROTEIN A (LPA): Lipoprotein (a): 148.8 nmol/L — ABNORMAL HIGH (ref <75.0)

## 2020-06-27 LAB — LIPID PANEL
Chol/HDL Ratio: 2.1 ratio (ref 0.0–5.0)
Cholesterol, Total: 78 mg/dL — ABNORMAL LOW (ref 100–199)
HDL: 37 mg/dL — ABNORMAL LOW (ref >39)
LDL Chol Calc (NIH): 10 mg/dL (ref 0–99)
Triglycerides: 200 mg/dL — ABNORMAL HIGH (ref 0–149)
VLDL Cholesterol Cal: 31 mg/dL (ref 5–40)

## 2020-07-24 ENCOUNTER — Ambulatory Visit (INDEPENDENT_AMBULATORY_CARE_PROVIDER_SITE_OTHER)
Admission: RE | Admit: 2020-07-24 | Discharge: 2020-07-24 | Disposition: A | Discharge status: Home / Self Care | Payer: Self-pay | Source: Ambulatory Visit | Attending: Cardiology | Admitting: Cardiology

## 2020-07-24 ENCOUNTER — Other Ambulatory Visit: Payer: Self-pay

## 2020-07-24 DIAGNOSIS — I2510 Atherosclerotic heart disease of native coronary artery without angina pectoris: Secondary | ICD-10-CM

## 2020-07-26 ENCOUNTER — Telehealth: Payer: Self-pay | Admitting: Cardiology

## 2020-07-26 NOTE — Telephone Encounter (Signed)
Patient was returning phone call in regards to results 

## 2020-12-01 ENCOUNTER — Other Ambulatory Visit: Payer: Self-pay | Admitting: Cardiology

## 2021-04-27 ENCOUNTER — Other Ambulatory Visit: Payer: Self-pay

## 2021-04-27 DIAGNOSIS — E7801 Familial hypercholesterolemia: Secondary | ICD-10-CM

## 2021-04-27 DIAGNOSIS — E782 Mixed hyperlipidemia: Secondary | ICD-10-CM

## 2021-04-27 MED ORDER — EVOLOCUMAB 140 MG/ML ~~LOC~~ SOAJ: 1.0000 mL | SUBCUTANEOUS | 3 refills | Status: DC

## 2021-04-27 NOTE — Telephone Encounter (Signed)
Refill sent to pharmacy.   

## 2021-05-30 ENCOUNTER — Telehealth: Payer: Self-pay | Admitting: Cardiology

## 2021-05-30 DIAGNOSIS — E7801 Familial hypercholesterolemia: Secondary | ICD-10-CM

## 2021-05-30 DIAGNOSIS — E782 Mixed hyperlipidemia: Secondary | ICD-10-CM

## 2021-05-30 MED ORDER — EVOLOCUMAB 140 MG/ML ~~LOC~~ SOAJ: 1.0000 mL | SUBCUTANEOUS | 3 refills | Status: DC

## 2021-05-30 NOTE — Telephone Encounter (Signed)
Lmom pt to call back and this med is listed as evolocumab. Will await call back from the pt

## 2021-05-30 NOTE — Addendum Note (Signed)
Addended by: Allean Found on: 05/30/2021 11:56 AM   Modules accepted: Orders

## 2021-05-30 NOTE — Telephone Encounter (Signed)
Pt is requesting a new rx for Repatha but I dont see this medication listed as a current one or listed historically.. please advise

## 2021-05-30 NOTE — Telephone Encounter (Signed)
Pt called to let me know that his wife will be calling me back I voiced understanding

## 2021-05-30 NOTE — Telephone Encounter (Signed)
Pts wife called and was able to provide me w/correct pharmacy information and rx was sent.

## 2021-06-06 ENCOUNTER — Other Ambulatory Visit: Payer: Self-pay | Admitting: Cardiology

## 2022-04-05 ENCOUNTER — Ambulatory Visit: Payer: 59 | Attending: Cardiology | Admitting: Cardiology

## 2022-04-09 ENCOUNTER — Encounter: Payer: Self-pay | Admitting: Cardiology

## 2022-05-03 ENCOUNTER — Telehealth: Payer: Self-pay | Admitting: Cardiology

## 2022-05-03 ENCOUNTER — Other Ambulatory Visit (HOSPITAL_COMMUNITY): Payer: Self-pay

## 2022-05-03 NOTE — Telephone Encounter (Signed)
Returned call to [employer]	[place] and obtained insurance info for 2024  ID [id number] BIN  [id number] PCN  [id number] GRP  [phone]

## 2022-05-03 NOTE — Telephone Encounter (Signed)
Pt c/o medication issue:  1. Name of Medication:   Evolocumab 140 MG/ML SOAJ    2. How are you currently taking this medication (dosage and times per day)? Inject 1 mL into the skin every 14 (fourteen) days. - Subcutaneous   3. Are you having a reaction (difficulty breathing--STAT)?   4. What is your medication issue? Deliliah from [place] called stating pt needs prior authorization from the insurance on this medication

## 2022-05-20 ENCOUNTER — Other Ambulatory Visit (HOSPITAL_COMMUNITY): Payer: Self-pay

## 2022-05-20 NOTE — Telephone Encounter (Signed)
Per test claim insurance requires fills to come from [employer], PA should not be required.    [facility] [phone]

## 2022-05-23 ENCOUNTER — Other Ambulatory Visit: Payer: Self-pay | Admitting: Cardiology

## 2022-05-23 DIAGNOSIS — E7801 Familial hypercholesterolemia: Secondary | ICD-10-CM

## 2022-05-23 DIAGNOSIS — E782 Mixed hyperlipidemia: Secondary | ICD-10-CM

## 2022-06-14 ENCOUNTER — Telehealth: Payer: Self-pay

## 2022-06-14 NOTE — Telephone Encounter (Signed)
Pharmacy Patient Advocate Encounter   Received notification from [place] that prior authorization for REPATHA 140 MG/ML INJ is needed.    Trying to submit PA renewal through [facility].   I cannot find these answers on chart notes. [place], please advise  Authorization form asks  "If the diagnosis is primary hyperlipdemia, is the patient's untreated LDL-C level 184m/dL or more before starting any lipid lowering therapy?"   "If the diagnosis is primary hyperlipdemia, does the patient have an ASCVD risk of >7.5%?"  "Has the patient tried/failed concomitant therapy with ezetimibe (Zetia) in combination with statin therapy (or alone if statin intolerant) for at least 12 weeks?*  "Does the patient have LDL-C level of > 100 mg/dL (without ASCVD) or >70 mg/dL (with ASCVD) or >569mdl (with extreme risk designation) after at least three months treatment with maximally tolerated statin dose, unless statin intolerant, and ezetimibe?"  "Recent: TC _______ LDL _______ TG _______ HDL _______ DATE _______. Please provide copies of recent labs as well as a second set of labs at least 6 months prior to confirm."  Please advise, currently don't have enough information for approval.   KaKarie SodaCPhT Pharmacy Patient Advocate Specialist Direct Number: [phone]: [phone]

## 2022-06-24 ENCOUNTER — Other Ambulatory Visit: Payer: Self-pay | Admitting: Cardiology

## 2022-06-24 NOTE — Telephone Encounter (Signed)
I sent a message to the patient to requesting information about this refill(allergies/On Repatha).

## 2022-07-08 ENCOUNTER — Telehealth: Payer: Self-pay | Admitting: Cardiology

## 2022-07-08 NOTE — Telephone Encounter (Signed)
Please see encounter from 2/16. Information needed for approval not on file, patient needs an appt.

## 2022-07-08 NOTE — Telephone Encounter (Signed)
Pt sent this message via Mychart to our scheduling pool:      Thanks for the update;  [facility] is asking for the following information before they will grant [place] to use our insurance and we can get the shot for the $5 as we have been getting them for the past years.   Continuation of theraphy, Prior Authorization to insurance  This is what they said they needed.  If you have any question please contact my wife Lannette Donath '@[phone]

## 2022-07-17 ENCOUNTER — Telehealth: Payer: Self-pay | Admitting: Cardiology

## 2022-07-17 NOTE — Telephone Encounter (Signed)
  Pt c/o medication issue:  1. Name of Medication: Repatha  2. How are you currently taking this medication (dosage and times per day)?   3. Are you having a reaction (difficulty breathing--STAT)?   4. What is your medication issue? [place] calling, she is following up prior auth for this medication

## 2022-07-18 NOTE — Telephone Encounter (Signed)
We need updated labs to submit PA. I called and spoke with pt. He will go to his PCP for labwork. He has been out of Repatha for 2 months now.

## 2022-07-18 NOTE — Telephone Encounter (Signed)
Patient is scheduled to see Dr. Harriet Masson 09/09/22 at 8:00 am.

## 2022-09-09 ENCOUNTER — Ambulatory Visit: Payer: 59 | Attending: Cardiology | Admitting: Cardiology

## 2022-09-09 ENCOUNTER — Encounter: Payer: Self-pay | Admitting: Cardiology

## 2022-09-09 ENCOUNTER — Ambulatory Visit (INDEPENDENT_AMBULATORY_CARE_PROVIDER_SITE_OTHER): Payer: 59

## 2022-09-09 VITALS — BP 136/88 | HR 47 | Ht 72.0 in | Wt 214.4 lb

## 2022-09-09 DIAGNOSIS — R001 Bradycardia, unspecified: Secondary | ICD-10-CM

## 2022-09-09 DIAGNOSIS — E7801 Familial hypercholesterolemia: Secondary | ICD-10-CM | POA: Diagnosis not present

## 2022-09-09 DIAGNOSIS — E782 Mixed hyperlipidemia: Secondary | ICD-10-CM | POA: Diagnosis not present

## 2022-09-09 DIAGNOSIS — Z79899 Other long term (current) drug therapy: Secondary | ICD-10-CM | POA: Diagnosis not present

## 2022-09-09 MED ORDER — ROSUVASTATIN CALCIUM 40 MG PO TABS: 40.0000 mg | ORAL_TABLET | Freq: Every day | ORAL | 3 refills | Status: AC

## 2022-09-09 MED ORDER — ICOSAPENT ETHYL 1 G PO CAPS: ORAL_CAPSULE | ORAL | 3 refills | Status: AC

## 2022-09-09 MED ORDER — REPATHA SURECLICK 140 MG/ML ~~LOC~~ SOAJ
SUBCUTANEOUS | 3 refills | Status: DC
Start: 2022-09-09 — End: 2022-11-14

## 2022-09-09 NOTE — Progress Notes (Signed)
Cardiology Office Note:    Date:  09/09/2022   ID:  Donald Frank, DOB 1960-12-20, MRN [id number]  PCP:  Noni Saupe, MD  Cardiologist:  Thomasene Ripple, DO  Electrophysiologist:  None   Referring MD: Noni Saupe, MD   " I am   History of Present Illness:    Donald Frank is a 62 y.o. male with a hx of familial hyperlipidemia, hypertension here today for follow-up visit.  I last saw the patient on June 26, 2020 at that time we talked about the medication regimen for his familial hyperlipidemia.  He was to continue his PCSK9 Baldo Daub, Crestor, as well as Vascepa.  He has not follow-up since that time.  Recently called because he has been out his Repatha.  Our team has started the preapproval.  He was asked to get blood work at his PCP office.  Past Medical History:  Diagnosis Date   Diverticulitis 05/12/2018=treatment done   Dyslipidemia    History of pancreatitis    Hyperlipidemia    Hypertension    Noncompliance     Past Surgical History:  Procedure Laterality Date   CHOLECYSTECTOMY     COLONOSCOPY  last 02/23/2009   HERNIA REPAIR     POLYPECTOMY      Current Medications: Current Meds  Medication Sig   [DISCONTINUED] Evolocumab (REPATHA SURECLICK) 140 MG/ML SOAJ Inject 140mg  subcutaneously every two weeks as directed. *Refrigerate*  Generic: Evolocumab   [DISCONTINUED] icosapent Ethyl (VASCEPA) 1 g capsule TAKE 2 CAPSULES BY MOUTH 2 TIMES DAILY.   [DISCONTINUED] rosuvastatin (CRESTOR) 40 MG tablet TAKE 1 TABLET BY MOUTH EVERY DAY     Allergies:   Lipitor [atorvastatin]   Social History   Socioeconomic History   Marital status: Married    Spouse name: Not on file   Number of children: Not on file   Years of education: Not on file   Highest education level: Not on file  Occupational History   Not on file  Tobacco Use   Smoking status: Never   Smokeless tobacco: Never  Vaping Use   Vaping Use: Never used  Substance and Sexual  Activity   Alcohol use: Yes    Alcohol/week: 3.0 standard drinks of alcohol    Types: 3 Standard drinks or equivalent per week    Comment: per week   Drug use: Not Currently   Sexual activity: Not on file  Other Topics Concern   Not on file  Social History Narrative   Not on file   Social Determinants of Health   Financial Resource Strain: Not on file  Food Insecurity: Not on file  Transportation Needs: Not on file  Physical Activity: Not on file  Stress: Not on file  Social Connections: Not on file     Family History: The patient's family history includes Heart Problems in his mother. There is no history of Colon cancer, Colon polyps, Esophageal cancer, Rectal cancer, or Stomach cancer.  ROS:   Review of Systems  Constitution: Negative for decreased appetite, fever and weight gain.  HENT: Negative for congestion, ear discharge, hoarse voice and sore throat.   Eyes: Negative for discharge, redness, vision loss in right eye and visual halos.  Cardiovascular: Negative for chest pain, dyspnea on exertion, leg swelling, orthopnea and palpitations.  Respiratory: Negative for cough, hemoptysis, shortness of breath and snoring.   Endocrine: Negative for heat intolerance and polyphagia.  Hematologic/Lymphatic: Negative for bleeding problem. Does not bruise/bleed easily.  Skin:  Negative for flushing, nail changes, rash and suspicious lesions.  Musculoskeletal: Negative for arthritis, joint pain, muscle cramps, myalgias, neck pain and stiffness.  Gastrointestinal: Negative for abdominal pain, bowel incontinence, diarrhea and excessive appetite.  Genitourinary: Negative for decreased libido, genital sores and incomplete emptying.  Neurological: Negative for brief paralysis, focal weakness, headaches and loss of balance.  Psychiatric/Behavioral: Negative for altered mental status, depression and suicidal ideas.  Allergic/Immunologic: Negative for HIV exposure and persistent infections.     EKGs/Labs/Other Studies Reviewed:    The following studies were reviewed today:   EKG:  The ekg ordered today demonstrates   Recent Labs: No results found for requested labs within last 365 days.  Recent Lipid Panel    Component Value Date/Time   CHOL 78 (L) 06/26/2020 1609   TRIG 200 (H) 06/26/2020 1609   HDL 37 (L) 06/26/2020 1609   CHOLHDL 2.1 06/26/2020 1609   LDLCALC 10 06/26/2020 1609    Physical Exam:    VS:  BP 136/88   Pulse (!) 47   Ht 6' (1.829 m)   Wt 214 lb 6.4 oz (97.3 kg)   SpO2 97%   BMI 29.08 kg/m     Wt Readings from Last 3 Encounters:  09/09/22 214 lb 6.4 oz (97.3 kg)  06/26/20 209 lb 3.2 oz (94.9 kg)  05/08/20 220 lb 6.4 oz (100 kg)     GEN: Well nourished, well developed in no acute distress HEENT: Normal NECK: No JVD; No carotid bruits LYMPHATICS: No lymphadenopathy CARDIAC: S1S2 noted,RRR, no murmurs, rubs, gallops RESPIRATORY:  Clear to auscultation without rales, wheezing or rhonchi  ABDOMEN: Soft, non-tender, non-distended, +bowel sounds, no guarding. EXTREMITIES: No edema, No cyanosis, no clubbing MUSCULOSKELETAL:  No deformity  SKIN: Warm and dry NEUROLOGIC:  Alert and oriented x 3, non-focal PSYCHIATRIC:  Normal affect, good insight  ASSESSMENT:    1. Bradycardia   2. Mixed hyperlipidemia   3. Familial hypercholesterolemia   4. Medication management    PLAN:     His EKG showed evidence of sinus bradycardia.  He denies any dizziness lightheadedness or syncope.  But he is not on any AV nodal blockers.  This is concerning for me would like to place a monitor on the patient also get TSH.  In terms of his family hyperlipidemia.  Will be fill his medications.  Will get lipid profile as well as LP(a) for his preapproval for the Repatha.   The patient is in agreement with the above plan. The patient left the office in stable condition.  The patient will follow up in 1 year or sooner if needed.   Medication Adjustments/Labs  and Tests Ordered: Current medicines are reviewed at length with the patient today.  Concerns regarding medicines are outlined above.  Orders Placed This Encounter  Procedures   Lipid panel   Lipoprotein A (LPA)   TSH+T4F+T3Free   LONG TERM MONITOR (3-14 DAYS)   EKG 12-Lead   Meds ordered this encounter  Medications   Evolocumab (REPATHA SURECLICK) 140 MG/ML SOAJ    Sig: Inject 140mg  subcutaneously every two weeks as directed. *Refrigerate*  Generic: Evolocumab    Dispense:  6 mL    Refill:  3   icosapent Ethyl (VASCEPA) 1 g capsule    Sig: TAKE 2 CAPSULES BY MOUTH 2 TIMES DAILY.    Dispense:  360 capsule    Refill:  3   rosuvastatin (CRESTOR) 40 MG tablet    Sig: Take 1 tablet (40 mg total) by  mouth daily.    Dispense:  90 tablet    Refill:  3    Patient Instructions  Medication Instructions:  Your physician recommends that you continue on your current medications as directed. Please refer to the Current Medication list given to you today.  *If you need a refill on your cardiac medications before your next appointment, please call your pharmacy*   Lab Work: Your physician recommends that you return for lab work in: Lipids, Lp(a), TSH+T4F+T3Free If you have labs (blood work) drawn today and your tests are completely normal, you will receive your results only by: MyChart Message (if you have MyChart) OR A paper copy in the mail If you have any lab test that is abnormal or we need to change your treatment, we will call you to review the results.   Testing/Procedures: Christena Deem- Long Term Monitor Instructions  Your physician has requested you wear a ZIO patch monitor for 14 days.  This is a single patch monitor. [employer] supplies one patch monitor per enrollment. Additional stickers are not available. Please do not apply patch if you will be having a Nuclear Stress Test,  Echocardiogram, Cardiac CT, MRI, or Chest Xray during the period you would be wearing the  monitor. The  patch cannot be worn during these tests. You cannot remove and re-apply the  ZIO XT patch monitor.  Your ZIO patch monitor will be mailed 3 day USPS to your address on file. It may take 3-5 days  to receive your monitor after you have been enrolled.  Once you have received your monitor, please review the enclosed instructions. Your monitor  has already been registered assigning a specific monitor serial # to you.  Billing and Patient Assistance Program Information  We have supplied [employer] with any of your insurance information on file for billing purposes. [employer] offers a sliding scale Patient Assistance Program for patients that do not have  insurance, or whose insurance does not completely cover the cost of the ZIO monitor.  You must apply for the Patient Assistance Program to qualify for this discounted rate.  To apply, please call [employer] at [phone], select option 4, select option 2, ask to apply for  Patient Assistance Program. Meredeth Ide will ask your household income, and how many people  are in your household. They will quote your out-of-pocket cost based on that information.  [employer] will also be able to set up a 10-month, interest-free payment plan if needed.  Applying the monitor   Shave hair from upper left chest.  Hold abrader disc by orange tab. Rub abrader in 40 strokes over the upper left chest as  indicated in your monitor instructions.  Clean area with 4 enclosed alcohol pads. Let dry.  Apply patch as indicated in monitor instructions. Patch will be placed under collarbone on left  side of chest with arrow pointing upward.  Rub patch adhesive wings for 2 minutes. Remove white label marked "1". Remove the white  label marked "2". Rub patch adhesive wings for 2 additional minutes.  While looking in a mirror, press and release button in center of patch. A small green light will  flash 3-4 times. This will be your only indicator that the monitor has been turned on.  Do  not shower for the first 24 hours. You may shower after the first 24 hours.  Press the button if you feel a symptom. You will hear a small click. Record Date, Time and  Symptom in the Patient Logbook.  When you are ready to remove the patch, follow instructions on the last 2 pages of Patient  Logbook. Stick patch monitor onto the last page of Patient Logbook.  Place Patient Logbook in the blue and white box. Use locking tab on box and tape box closed  securely. The blue and white box has prepaid postage on it. Please place it in the mailbox as  soon as possible. Your physician should have your test results approximately 7 days after the  monitor has been mailed back to [facility].  Call [facility] Customer Care at [phone] if you have questions regarding  your ZIO XT patch monitor. Call them immediately if you see an orange light blinking on your  monitor.  If your monitor falls off in less than 4 days, contact our Monitor department at [phone].  If your monitor becomes loose or falls off after 4 days call [employer] at [phone] for  suggestions on securing your monitor    Follow-Up: At [facility], you and your health needs are our priority.  As part of our continuing mission to provide you with exceptional heart care, we have created designated Provider Care Teams.  These Care Teams include your primary Cardiologist (physician) and Advanced Practice Providers (APPs -  Physician Assistants and Nurse Practitioners) who all work together to provide you with the care you need, when you need it.   Your next appointment:   1 year(s)  Provider:   Thomasene Ripple, DO    Adopting a Healthy Lifestyle.  Know what a healthy weight is for you (roughly BMI <25) and aim to maintain this   Aim for 7+ servings of fruits and vegetables daily   65-80+ fluid ounces of water or unsweet tea for healthy kidneys   Limit to max 1 drink of alcohol per day; avoid  smoking/tobacco   Limit animal fats in diet for cholesterol and heart health - choose grass fed whenever available   Avoid highly processed foods, and foods high in saturated/trans fats   Aim for low stress - take time to unwind and care for your mental health   Aim for 150 min of moderate intensity exercise weekly for heart health, and weights twice weekly for bone health   Aim for 7-9 hours of sleep daily   When it comes to diets, agreement about the perfect plan isnt easy to find, even among the experts. Experts at the [facility] [employer] developed an idea known as the Healthy Eating Plate. Just imagine a plate divided into logical, healthy portions.   The emphasis is on diet quality:   Load up on vegetables and fruits - one-half of your plate: Aim for color and variety, and remember that potatoes dont count.   Go for whole grains - one-quarter of your plate: Whole wheat, barley, wheat berries, quinoa, oats, brown rice, and foods made with them. If you want pasta, go with whole wheat pasta.   Protein power - one-quarter of your plate: Fish, chicken, beans, and nuts are all healthy, versatile protein sources. Limit red meat.   The diet, however, does go beyond the plate, offering a few other suggestions.   Use healthy plant oils, such as olive, canola, soy, corn, sunflower and peanut. Check the labels, and avoid partially hydrogenated oil, which have unhealthy trans fats.   If youre thirsty, drink water. Coffee and tea are good in moderation, but skip sugary drinks and limit milk and dairy products to one or two daily  servings.   The type of carbohydrate in the diet is more important than the amount. Some sources of carbohydrates, such as vegetables, fruits, whole grains, and beans-are healthier than others.   Finally, stay active  Signed, Thomasene Ripple, DO  09/09/2022 8:34 AM    [facility]

## 2022-09-09 NOTE — Patient Instructions (Signed)
Medication Instructions:  Your physician recommends that you continue on your current medications as directed. Please refer to the Current Medication list given to you today.  *If you need a refill on your cardiac medications before your next appointment, please call your pharmacy*   Lab Work: Your physician recommends that you return for lab work in: Lipids, Lp(a), TSH+T4F+T3Free If you have labs (blood work) drawn today and your tests are completely normal, you will receive your results only by: MyChart Message (if you have MyChart) OR A paper copy in the mail If you have any lab test that is abnormal or we need to change your treatment, we will call you to review the results.   Testing/Procedures: Christena Deem- Long Term Monitor Instructions  Your physician has requested you wear a ZIO patch monitor for 14 days.  This is a single patch monitor. [employer] supplies one patch monitor per enrollment. Additional stickers are not available. Please do not apply patch if you will be having a Nuclear Stress Test,  Echocardiogram, Cardiac CT, MRI, or Chest Xray during the period you would be wearing the  monitor. The patch cannot be worn during these tests. You cannot remove and re-apply the  ZIO XT patch monitor.  Your ZIO patch monitor will be mailed 3 day USPS to your address on file. It may take 3-5 days  to receive your monitor after you have been enrolled.  Once you have received your monitor, please review the enclosed instructions. Your monitor  has already been registered assigning a specific monitor serial # to you.  Billing and Patient Assistance Program Information  We have supplied [employer] with any of your insurance information on file for billing purposes. [employer] offers a sliding scale Patient Assistance Program for patients that do not have  insurance, or whose insurance does not completely cover the cost of the ZIO monitor.  You must apply for the Patient Assistance Program to  qualify for this discounted rate.  To apply, please call [employer] at [phone], select option 4, select option 2, ask to apply for  Patient Assistance Program. Meredeth Ide will ask your household income, and how many people  are in your household. They will quote your out-of-pocket cost based on that information.  [employer] will also be able to set up a 32-month, interest-free payment plan if needed.  Applying the monitor   Shave hair from upper left chest.  Hold abrader disc by orange tab. Rub abrader in 40 strokes over the upper left chest as  indicated in your monitor instructions.  Clean area with 4 enclosed alcohol pads. Let dry.  Apply patch as indicated in monitor instructions. Patch will be placed under collarbone on left  side of chest with arrow pointing upward.  Rub patch adhesive wings for 2 minutes. Remove white label marked "1". Remove the white  label marked "2". Rub patch adhesive wings for 2 additional minutes.  While looking in a mirror, press and release button in center of patch. A small green light will  flash 3-4 times. This will be your only indicator that the monitor has been turned on.  Do not shower for the first 24 hours. You may shower after the first 24 hours.  Press the button if you feel a symptom. You will hear a small click. Record Date, Time and  Symptom in the Patient Logbook.  When you are ready to remove the patch, follow instructions on the last 2 pages of Patient  Logbook. Stick patch monitor  onto the last page of Patient Logbook.  Place Patient Logbook in the blue and white box. Use locking tab on box and tape box closed  securely. The blue and white box has prepaid postage on it. Please place it in the mailbox as  soon as possible. Your physician should have your test results approximately 7 days after the  monitor has been mailed back to [facility].  Call [facility] Customer Care at [phone] if you have questions regarding  your ZIO XT  patch monitor. Call them immediately if you see an orange light blinking on your  monitor.  If your monitor falls off in less than 4 days, contact our Monitor department at [phone].  If your monitor becomes loose or falls off after 4 days call [employer] at [phone] for  suggestions on securing your monitor    Follow-Up: At [facility], you and your health needs are our priority.  As part of our continuing mission to provide you with exceptional heart care, we have created designated Provider Care Teams.  These Care Teams include your primary Cardiologist (physician) and Advanced Practice Providers (APPs -  Physician Assistants and Nurse Practitioners) who all work together to provide you with the care you need, when you need it.   Your next appointment:   1 year(s)  Provider:   Thomasene Ripple, DO

## 2022-09-09 NOTE — Progress Notes (Unsigned)
Enrolled for [employer] to mail a ZIO XT long term holter monitor to the patients address on file.  

## 2022-09-10 LAB — LIPID PANEL
Chol/HDL Ratio: 5.7 ratio — ABNORMAL HIGH (ref 0.0–5.0)
Cholesterol, Total: 170 mg/dL (ref 100–199)

## 2022-09-10 LAB — TSH+T4F+T3FREE: Free T4: 1.01 ng/dL (ref 0.82–1.77)

## 2022-09-10 LAB — LIPOPROTEIN A (LPA)

## 2022-09-11 ENCOUNTER — Telehealth: Payer: Self-pay | Admitting: Cardiology

## 2022-09-11 ENCOUNTER — Other Ambulatory Visit (HOSPITAL_COMMUNITY): Payer: Self-pay

## 2022-09-11 LAB — LIPID PANEL
HDL: 30 mg/dL — ABNORMAL LOW (ref >39)
LDL Chol Calc (NIH): 89 mg/dL (ref 0–99)
Triglycerides: 304 mg/dL — ABNORMAL HIGH (ref 0–149)
VLDL Cholesterol Cal: 51 mg/dL — ABNORMAL HIGH (ref 5–40)

## 2022-09-11 LAB — TSH+T4F+T3FREE
T3, Free: 2.7 pg/mL (ref 2.0–4.4)
TSH: 2.93 u[IU]/mL (ref 0.450–4.500)

## 2022-09-11 NOTE — Telephone Encounter (Signed)
Please see encounter from 06/14/22. We do not have enough information for approval.

## 2022-09-11 NOTE — Telephone Encounter (Signed)
Pt c/o medication issue:  1. Name of Medication: Evolocumab (REPATHA SURECLICK) 140 MG/ML SOAJ   2. How are you currently taking this medication (dosage and times per day)? Inject 140mg  subcutaneously every two weeks as directed. *Refrigerate* Generic: Evolocumab   3. Are you having a reaction (difficulty breathing--STAT)? No  4. What is your medication issue? Pharmacy called to see if PA is going to be started on this medication. Please advise

## 2022-09-12 DIAGNOSIS — R001 Bradycardia, unspecified: Secondary | ICD-10-CM | POA: Diagnosis not present

## 2022-09-12 NOTE — Telephone Encounter (Signed)
Lab results are back. Please resubmit PA

## 2022-09-13 NOTE — Telephone Encounter (Signed)
I can't find an untreated LDL-C level of 190mg /dL or more before starting any lipid lowering therapy

## 2022-09-16 NOTE — Telephone Encounter (Signed)
Updating PA.  Key: [id number]

## 2022-09-17 ENCOUNTER — Telehealth: Payer: Self-pay | Admitting: Cardiology

## 2022-09-17 NOTE — Telephone Encounter (Signed)
Duplicate encounters.Marland Kitchen Spoke with [facility] gave her updated key from previous encounter and she stated PA was denied today at 12:09 pm. She is faxing over appeal.

## 2022-09-17 NOTE — Telephone Encounter (Signed)
Pt c/o medication issue:  1. Name of Medication:           Evolocumab (REPATHA SURECLICK) 140 MG/ML SOAJ     2. How are you currently taking this medication (dosage and times per day)?   Inject 140mg  subcutaneously every two weeks as directed. *Refrigerate* Generic: Evolocumab    3. Are you having a reaction (difficulty breathing--STAT)? No  4. What is your medication issue? Pharmacy stated the PA was closed by provider and they'd like a call back to know why. They've sent a few messages and stated no one has responded. Please advise

## 2022-09-17 NOTE — Telephone Encounter (Signed)
Duplicate encounters. Please see encounter from 5/15. Spoke with [facility] pharmacy gave her updated key from previous encounter and she stated PA was denied today at 12:09 pm. She is faxing over appeal.

## 2022-09-30 ENCOUNTER — Telehealth: Payer: Self-pay | Admitting: Cardiology

## 2022-09-30 DIAGNOSIS — E7801 Familial hypercholesterolemia: Secondary | ICD-10-CM

## 2022-09-30 DIAGNOSIS — E782 Mixed hyperlipidemia: Secondary | ICD-10-CM

## 2022-09-30 NOTE — Telephone Encounter (Signed)
Pt c/o medication issue:  1. Name of Medication:   Evolocumab (REPATHA SURECLICK) 140 MG/ML SOAJ   2. How are you currently taking this medication (dosage and times per day)?   3. Are you having a reaction (difficulty breathing--STAT)?   4. What is your medication issue?   Caller stated patient will need prior authorization for this medication and the status of patient receiving this medication.

## 2022-09-30 NOTE — Telephone Encounter (Signed)
Per telephone encounter 5/21 Appeal for denial of PA was to be sent to our office.  Any information regarding this?

## 2022-09-30 NOTE — Telephone Encounter (Signed)
Can you look into this?

## 2022-10-08 ENCOUNTER — Encounter: Payer: Self-pay | Admitting: Cardiology

## 2022-10-08 NOTE — Telephone Encounter (Signed)
I see a message that states to review questions 2/16 encounter.  These are not questions for the patient so who does this need to go to.?

## 2022-10-09 ENCOUNTER — Other Ambulatory Visit (HOSPITAL_COMMUNITY): Payer: Self-pay

## 2022-10-09 NOTE — Telephone Encounter (Signed)
Patient's plan requires med to be filled at [facility].

## 2022-10-09 NOTE — Telephone Encounter (Signed)
Hi there Rosey Bath,   Super confusing, I know. There's a more recent update from May. Looks like [place], [facility] resubmitted and it was denied.

## 2022-11-14 ENCOUNTER — Telehealth: Payer: Self-pay | Admitting: Cardiology

## 2022-11-14 DIAGNOSIS — E7801 Familial hypercholesterolemia: Secondary | ICD-10-CM

## 2022-11-14 DIAGNOSIS — E782 Mixed hyperlipidemia: Secondary | ICD-10-CM

## 2022-11-14 MED ORDER — REPATHA SURECLICK 140 MG/ML ~~LOC~~ SOAJ
SUBCUTANEOUS | 3 refills | Status: DC
Start: 2022-11-14 — End: 2022-12-16

## 2022-11-14 NOTE — Telephone Encounter (Signed)
*  STAT* If patient is at the pharmacy, call can be transferred to refill team.   1. Which medications need to be refilled? (please list name of each medication and dose if known) need a prior authorization for Repatha   2. Would you like to learn more about the convenience, safety, & potential cost savings by using the [facility]?      3. Are you open to using the [facility] (Type [facility]. ).   4. Which pharmacy/location (including street and city if local pharmacy) is medication to be sent to? [phone]   5. Do they need a 30 day or 90 day supply? 90 days and refills

## 2022-11-20 ENCOUNTER — Other Ambulatory Visit (HOSPITAL_COMMUNITY): Payer: Self-pay

## 2022-11-28 NOTE — Telephone Encounter (Signed)
Wife is calling to follow-up on patient's Repatha authorization.

## 2022-11-28 NOTE — Telephone Encounter (Signed)
I have resubmitted the PA Key: [id number]

## 2022-11-28 NOTE — Telephone Encounter (Addendum)
Patient needs Prior Auth.    I see where it was previously denied but an appeal was being sent over. It is multiple encounters about the PA. Thayer Ohm did state it needs to be filled through [employer]. Im not sure were we are in the process.

## 2022-11-29 NOTE — Telephone Encounter (Signed)
PA came back canceled- said medication had already been approved. Tried to call patient to see what the issue is. LVM for pt to call back. We sent Rx 7/18 to [facility]

## 2022-12-16 ENCOUNTER — Encounter: Payer: Self-pay | Admitting: Cardiology

## 2022-12-16 ENCOUNTER — Ambulatory Visit: Payer: 59 | Attending: Cardiology | Admitting: Cardiology

## 2022-12-16 VITALS — BP 136/88 | HR 50 | Ht 72.0 in | Wt 217.2 lb

## 2022-12-16 DIAGNOSIS — R5383 Other fatigue: Secondary | ICD-10-CM

## 2022-12-16 DIAGNOSIS — E7801 Familial hypercholesterolemia: Secondary | ICD-10-CM

## 2022-12-16 DIAGNOSIS — R0683 Snoring: Secondary | ICD-10-CM

## 2022-12-16 DIAGNOSIS — E782 Mixed hyperlipidemia: Secondary | ICD-10-CM | POA: Diagnosis not present

## 2022-12-16 MED ORDER — REPATHA SURECLICK 140 MG/ML ~~LOC~~ SOAJ
SUBCUTANEOUS | 3 refills | Status: DC
Start: 2022-12-16 — End: 2023-11-12

## 2022-12-16 NOTE — Patient Instructions (Signed)
Medication Instructions:  Continue same medications *If you need a refill on your cardiac medications before your next appointment, please call your pharmacy*   Lab Work: Vit D today   Testing/Procedures: Sleep Study   Follow-Up: At [facility], you and your health needs are our priority.  As part of our continuing mission to provide you with exceptional heart care, we have created designated Provider Care Teams.  These Care Teams include your primary Cardiologist (physician) and Advanced Practice Providers (APPs -  Physician Assistants and Nurse Practitioners) who all work together to provide you with the care you need, when you need it.  We recommend signing up for the patient portal called "MyChart".  Sign up information is provided on this After Visit Summary.  MyChart is used to connect with patients for Virtual Visits (Telemedicine).  Patients are able to view lab/test results, encounter notes, upcoming appointments, etc.  Non-urgent messages can be sent to your provider as well.   To learn more about what you can do with MyChart, go to [email or web address].    Your next appointment:  1 year    Provider:  Dr.Tobb

## 2022-12-16 NOTE — Progress Notes (Addendum)
Cardiology Office Note:    Date:  12/16/2022   ID:  Donald Frank, DOB 1961/01/21, MRN [id number]  PCP:  Noni Saupe, MD  Cardiologist:  Thomasene Ripple, DO  Electrophysiologist:  None   Referring MD: Noni Saupe, MD   " I am ok:"  History of Present Illness:    Donald Frank is a 62 y.o. male with a hx of familial hyperlipidemia on Repatha, hypertension here for follow-up visit.  I last saw the patient on Sep 09, 2022 at that time his EKG showed sinus bradycardia I placed a monitor on the patient as well as send information for refilling his Repatha.  He did wear his monitor which did not show any evidence of significant AV block, he did have an episode of NSVT and rare PSVT.  No lightheadedness no dizziness.  He shares with me that his insurance company had The Repatha.  He also notes that he is experiencing some fatigue.  He is not sleeping well and admits to worsening snoring.  He has had sleep study in the past but they are all been home sleep study and inconclusive.  Past Medical History:  Diagnosis Date   Diverticulitis 05/12/2018=treatment done   Dyslipidemia    History of pancreatitis    Hyperlipidemia    Hypertension    Noncompliance     Past Surgical History:  Procedure Laterality Date   CHOLECYSTECTOMY     COLONOSCOPY  last 02/23/2009   HERNIA REPAIR     POLYPECTOMY      Current Medications: Current Meds  Medication Sig   icosapent Ethyl (VASCEPA) 1 g capsule TAKE 2 CAPSULES BY MOUTH 2 TIMES DAILY.   rosuvastatin (CRESTOR) 40 MG tablet Take 1 tablet (40 mg total) by mouth daily.     Allergies:   Lipitor [atorvastatin]   Social History   Socioeconomic History   Marital status: Married    Spouse name: Not on file   Number of children: Not on file   Years of education: Not on file   Highest education level: Not on file  Occupational History   Not on file  Tobacco Use   Smoking status: Never   Smokeless tobacco: Never   Vaping Use   Vaping status: Never Used  Substance and Sexual Activity   Alcohol use: Yes    Alcohol/week: 3.0 standard drinks of alcohol    Types: 3 Standard drinks or equivalent per week    Comment: per week   Drug use: Not Currently   Sexual activity: Not on file  Other Topics Concern   Not on file  Social History Narrative   Not on file   Social Determinants of Health   Financial Resource Strain: Not on file  Food Insecurity: Not on file  Transportation Needs: Not on file  Physical Activity: Not on file  Stress: Not on file  Social Connections: Not on file     Family History: The patient's family history includes Heart Problems in his mother. There is no history of Colon cancer, Colon polyps, Esophageal cancer, Rectal cancer, or Stomach cancer.  ROS:   Review of Systems  Constitution: Negative for decreased appetite, fever and weight gain.  HENT: Negative for congestion, ear discharge, hoarse voice and sore throat.   Eyes: Negative for discharge, redness, vision loss in right eye and visual halos.  Cardiovascular: Negative for chest pain, dyspnea on exertion, leg swelling, orthopnea and palpitations.  Respiratory: Negative for cough, hemoptysis, shortness of  breath and snoring.   Endocrine: Negative for heat intolerance and polyphagia.  Hematologic/Lymphatic: Negative for bleeding problem. Does not bruise/bleed easily.  Skin: Negative for flushing, nail changes, rash and suspicious lesions.  Musculoskeletal: Negative for arthritis, joint pain, muscle cramps, myalgias, neck pain and stiffness.  Gastrointestinal: Negative for abdominal pain, bowel incontinence, diarrhea and excessive appetite.  Genitourinary: Negative for decreased libido, genital sores and incomplete emptying.  Neurological: Negative for brief paralysis, focal weakness, headaches and loss of balance.  Psychiatric/Behavioral: Negative for altered mental status, depression and suicidal ideas.   Allergic/Immunologic: Negative for HIV exposure and persistent infections.    EKGs/Labs/Other Studies Reviewed:    The following studies were reviewed today:   EKG:  The ekg ordered today demonstrates   Recent Labs: 09/09/2022: TSH 2.930  Recent Lipid Panel    Component Value Date/Time   CHOL 170 09/09/2022 0830   TRIG 304 (H) 09/09/2022 0830   HDL 30 (L) 09/09/2022 0830   CHOLHDL 5.7 (H) 09/09/2022 0830   LDLCALC 89 09/09/2022 0830    Physical Exam:    VS:  BP 136/88 (BP Location: Left Arm, Patient Position: Sitting, Cuff Size: Normal)   Pulse (!) 50   Ht 6' (1.829 m)   Wt 217 lb 3.2 oz (98.5 kg)   SpO2 98%   BMI 29.46 kg/m     Wt Readings from Last 3 Encounters:  12/16/22 217 lb 3.2 oz (98.5 kg)  09/09/22 214 lb 6.4 oz (97.3 kg)  06/26/20 209 lb 3.2 oz (94.9 kg)     GEN: Well nourished, well developed in no acute distress HEENT: Normal NECK: No JVD; No carotid bruits LYMPHATICS: No lymphadenopathy CARDIAC: S1S2 noted,RRR, no murmurs, rubs, gallops RESPIRATORY:  Clear to auscultation without rales, wheezing or rhonchi  ABDOMEN: Soft, non-tender, non-distended, +bowel sounds, no guarding. EXTREMITIES: No edema, No cyanosis, no clubbing MUSCULOSKELETAL:  No deformity  SKIN: Warm and dry NEUROLOGIC:  Alert and oriented x 3, non-focal PSYCHIATRIC:  Normal affect, good insight  ASSESSMENT:    1. Snores   2. Other fatigue   3. Mixed hyperlipidemia   4. Familial hypercholesterolemia    PLAN:     He will benefit from getting a in-house sleep study as all of his home tests have been inconclusive and the patient is still very much symptomatic.  Will place order for his sleep study again. We discussed his monitor result.  At this time there is no need for any medications he still is bradycardia.  In terms of his familial hyperlipidemia he should be on his Repatha will reorder and send refills.  He had an LP(a) which was done back in May.  With fatigue as well  we will get vitamin D level.  The patient is in agreement with the above plan. The patient left the office in stable condition.  The patient will follow up in 1 year or sooner if needed.   Medication Adjustments/Labs and Tests Ordered: Current medicines are reviewed at length with the patient today.  Concerns regarding medicines are outlined above.  Orders Placed This Encounter  Procedures   Vitamin D (25 hydroxy)   Split night study   Meds ordered this encounter  Medications   Evolocumab (REPATHA SURECLICK) 140 MG/ML SOAJ    Sig: Inject 140mg  subcutaneously every two weeks as directed. *Refrigerate*  Generic: Evolocumab    Dispense:  6 mL    Refill:  3    Patient Instructions  Medication Instructions:  Continue same medications *If  you need a refill on your cardiac medications before your next appointment, please call your pharmacy*   Lab Work: Vit D today   Testing/Procedures: Sleep Study   Follow-Up: At [facility], you and your health needs are our priority.  As part of our continuing mission to provide you with exceptional heart care, we have created designated Provider Care Teams.  These Care Teams include your primary Cardiologist (physician) and Advanced Practice Providers (APPs -  Physician Assistants and Nurse Practitioners) who all work together to provide you with the care you need, when you need it.  We recommend signing up for the patient portal called "MyChart".  Sign up information is provided on this After Visit Summary.  MyChart is used to connect with patients for Virtual Visits (Telemedicine).  Patients are able to view lab/test results, encounter notes, upcoming appointments, etc.  Non-urgent messages can be sent to your provider as well.   To learn more about what you can do with MyChart, go to [email or web address].    Your next appointment:  1 year    Provider:  Dr.Elaura Calix     Adopting a Healthy Lifestyle.  Know what a healthy weight  is for you (roughly BMI <25) and aim to maintain this   Aim for 7+ servings of fruits and vegetables daily   65-80+ fluid ounces of water or unsweet tea for healthy kidneys   Limit to max 1 drink of alcohol per day; avoid smoking/tobacco   Limit animal fats in diet for cholesterol and heart health - choose grass fed whenever available   Avoid highly processed foods, and foods high in saturated/trans fats   Aim for low stress - take time to unwind and care for your mental health   Aim for 150 min of moderate intensity exercise weekly for heart health, and weights twice weekly for bone health   Aim for 7-9 hours of sleep daily   When it comes to diets, agreement about the perfect plan isnt easy to find, even among the experts. Experts at the [facility] [employer] developed an idea known as the Healthy Eating Plate. Just imagine a plate divided into logical, healthy portions.   The emphasis is on diet quality:   Load up on vegetables and fruits - one-half of your plate: Aim for color and variety, and remember that potatoes dont count.   Go for whole grains - one-quarter of your plate: Whole wheat, barley, wheat berries, quinoa, oats, brown rice, and foods made with them. If you want pasta, go with whole wheat pasta.   Protein power - one-quarter of your plate: Fish, chicken, beans, and nuts are all healthy, versatile protein sources. Limit red meat.   The diet, however, does go beyond the plate, offering a few other suggestions.   Use healthy plant oils, such as olive, canola, soy, corn, sunflower and peanut. Check the labels, and avoid partially hydrogenated oil, which have unhealthy trans fats.   If youre thirsty, drink water. Coffee and tea are good in moderation, but skip sugary drinks and limit milk and dairy products to one or two daily servings.   The type of carbohydrate in the diet is more important than the amount. Some sources of carbohydrates, such as  vegetables, fruits, whole grains, and beans-are healthier than others.   Finally, stay active  Signed, Thomasene Ripple, DO  12/16/2022 10:01 AM    [facility]

## 2022-12-17 LAB — VITAMIN D 25 HYDROXY (VIT D DEFICIENCY, FRACTURES)

## 2022-12-23 ENCOUNTER — Telehealth: Payer: Self-pay

## 2022-12-23 ENCOUNTER — Other Ambulatory Visit (HOSPITAL_COMMUNITY): Payer: Self-pay

## 2022-12-23 ENCOUNTER — Telehealth: Payer: Self-pay | Admitting: Cardiology

## 2022-12-23 NOTE — Telephone Encounter (Signed)
  ''   I have spoke with the pts plan ([employer]). I specifically spoke with representative Alda Lea) who stated a P/A was needed. However I informed her that we have received documentation from [facility] that The P/A has already been previously approved (Key: [id number]). She also ran a test claim and saw the approval for Repatha. She is now reaching out to her clinical coordinator to inform her of this to get the patients Rx scheduled for delivery. I have asked her for a fax of when the rx should be scheduled for delivery. She has informed me this could be 24-48hrs.''  CONTACT # [phone]

## 2022-12-23 NOTE — Telephone Encounter (Signed)
Returned call to pt's wife. In formed her this message will be sent to the prior auth team.

## 2022-12-23 NOTE — Telephone Encounter (Signed)
Pt c/o medication issue:  1. Name of Medication:  Evolocumab (REPATHA SURECLICK) 140 MG/ML SOAJ  2. How are you currently taking this medication (dosage and times per day)?   3. Are you having a reaction (difficulty breathing--STAT)?   4. What is your medication issue?   Patient's wife states a prior authorization is needed for Repatha. She states she has been trying to have this taken care of for a while and there has been a delay.

## 2023-01-19 IMAGING — CT CT CARDIAC CORONARY ARTERY CALCIUM SCORE
3 series · 14 of 20 positions shown, 15 images · non-contrast
Comparison: None.
COMPARISON: None.

Addendum:
EXAM:
OVER-READ INTERPRETATION  CT CHEST

The following report is an over-read performed by radiologist Dr.
Mao Guitron [REDACTED] on 07/24/2020. This
over-read does not include interpretation of cardiac or coronary
anatomy or pathology. The coronary calcium score interpretation by
the cardiologist is attached.
CLINICAL DATA: Cardiovascular Disease Risk stratification 59 year
old with hyperlipidemia
Coronary Calcium Score
TECHNIQUE: A gated, non-contrast computed tomography scan of the heart was
performed using 3mm slice thickness. Axial images were analyzed on a
dedicated workstation. Calcium scoring of the coronary arteries was
performed using the Agatston method.

[Series 2: casc 3.0 bv41 2 bestdiast 74 % · axial · 0.40mm/px · z∈[+78,+168]mm · 4 of 50 slices shown, 5 images]
[im 10/50  vessel]
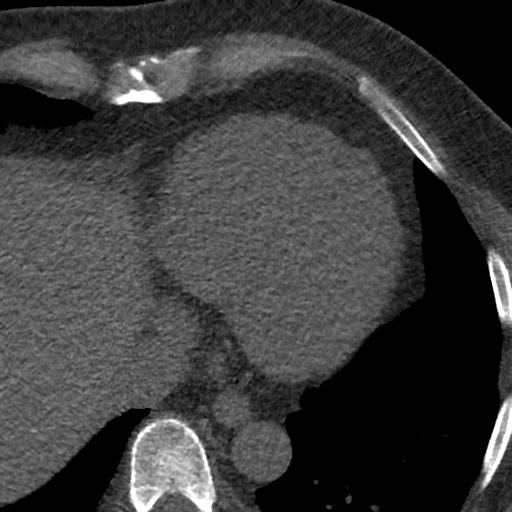
[im 10/50  lung]
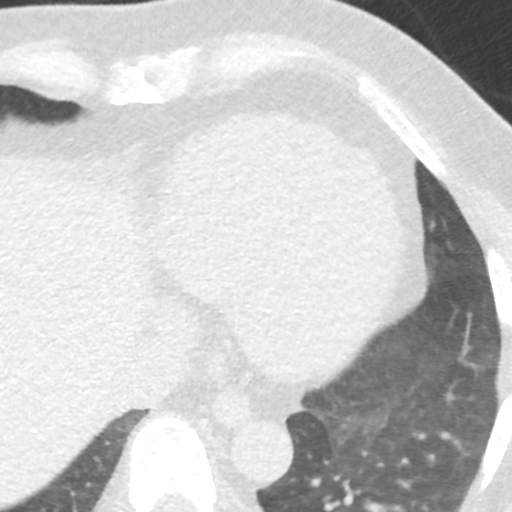
[im 20/50  vessel]
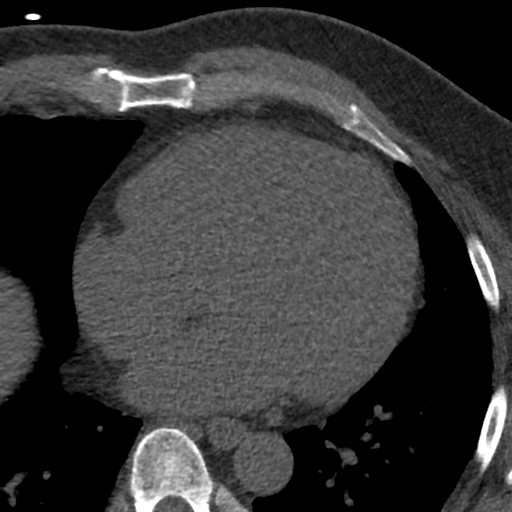
[im 30/50  vessel]
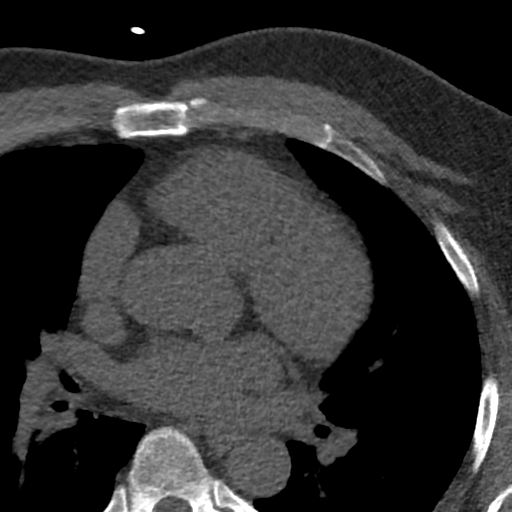
[im 40/50  vessel]
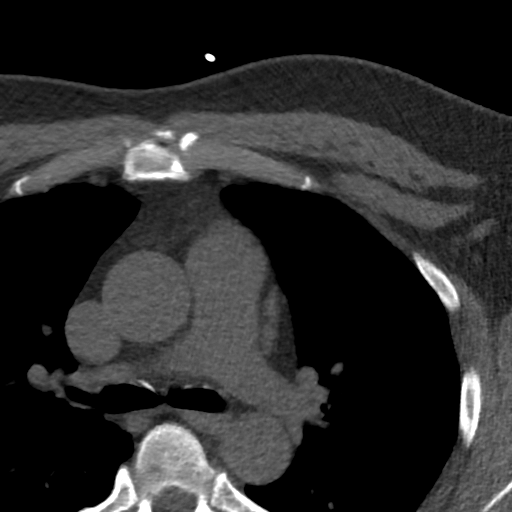

[Series 3: lung 74 % · axial · 0.74mm/px · z∈[+74,+170]mm · 5 of 50 slices shown]
[im 9/50  lung]
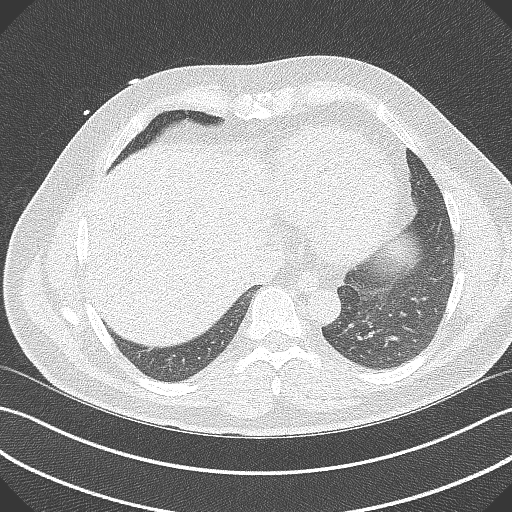
[im 17/50  lung]
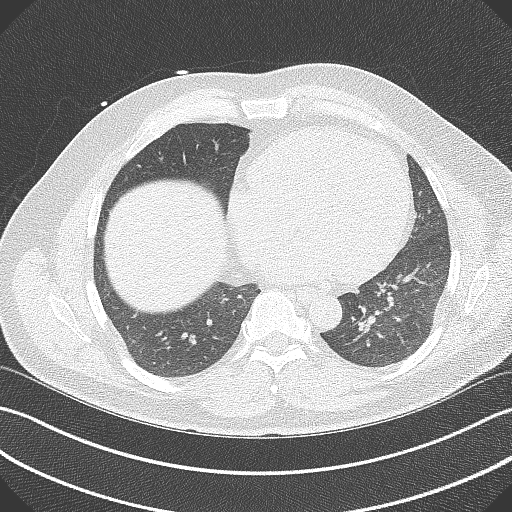
[im 25/50  lung]
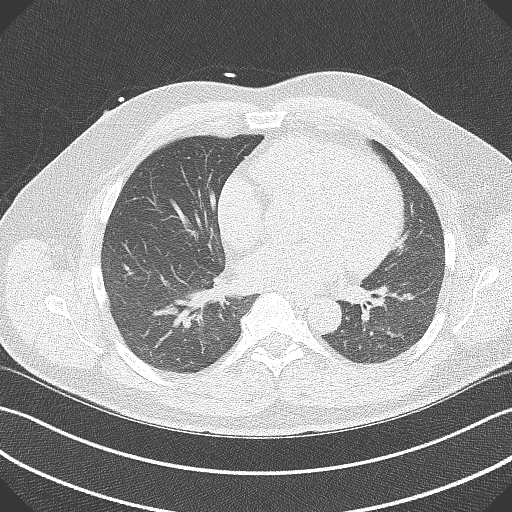
[im 33/50  lung]
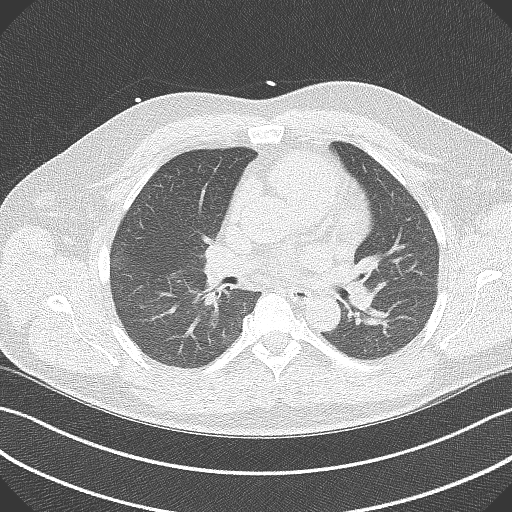
[im 41/50  lung]
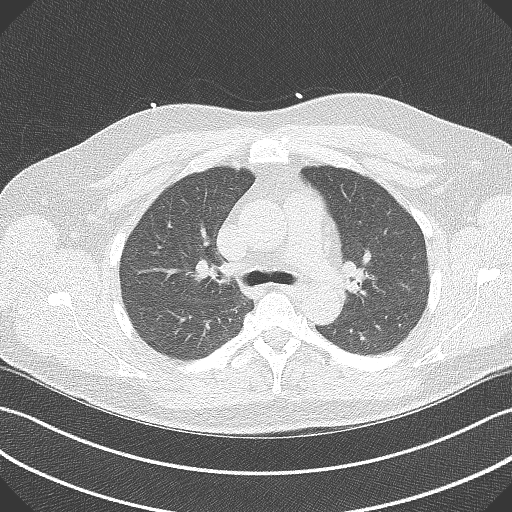

[Series 4: lung st 74 % · axial · 0.74mm/px · z∈[+74,+170]mm · 5 of 50 slices shown]
[im 9/50  lung]
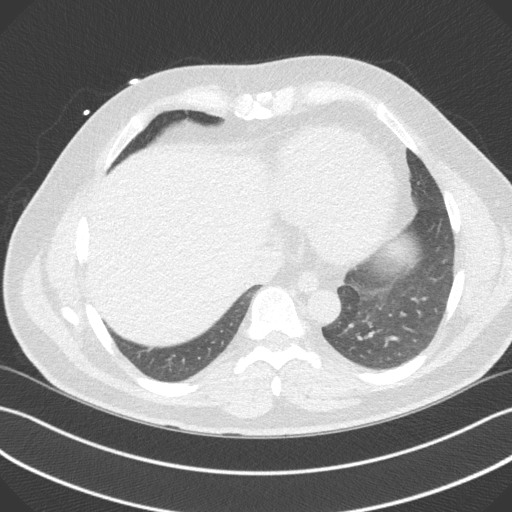
[im 17/50  lung]
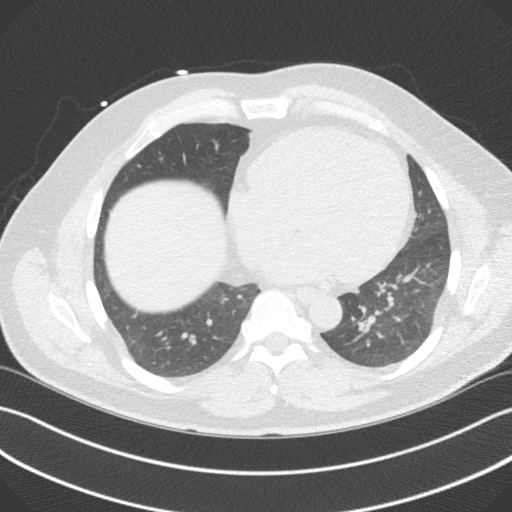
[im 25/50  lung]
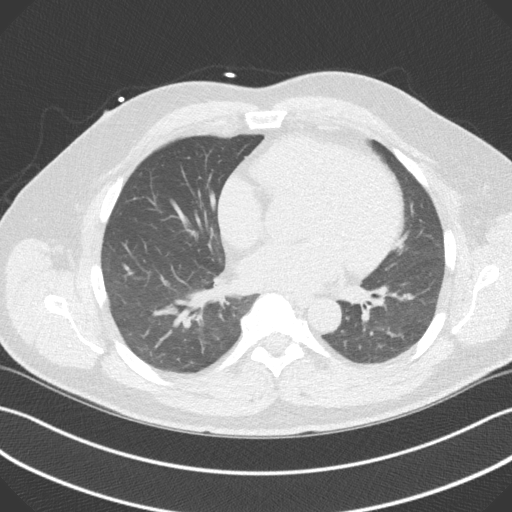
[im 33/50  lung]
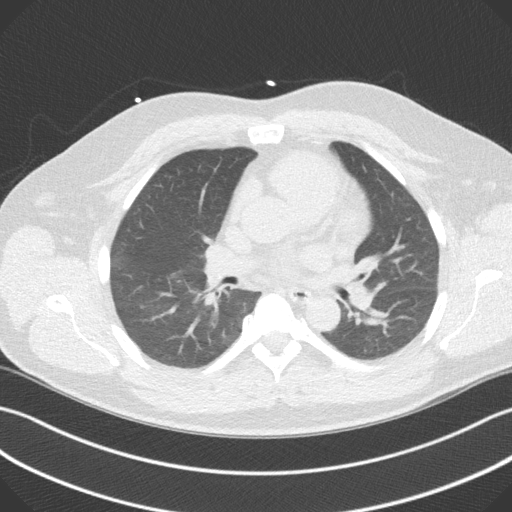
[im 41/50  lung]
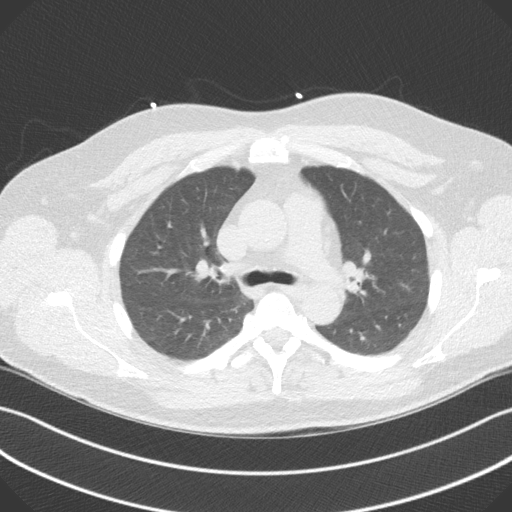

[14 of 20 positions shown; findings below may reference images not displayed]

FINDINGS: Vascular: The heart size appears top normal. No pericardial fluid
identified.

Mediastinum/Nodes: Visualized mediastinum and hilar regions
demonstrate no lymphadenopathy or masses.

Lungs/Pleura: Visualized lungs show no evidence of pulmonary edema,
consolidation, pneumothorax, nodule or pleural fluid.

Upper Abdomen: No acute abnormality.

Musculoskeletal: No chest wall mass or suspicious bone lesions
identified.
IMPRESSION: No significant incidental findings.  Top-normal heart size.
FINDINGS: Coronary arteries: Normal origins.

Coronary Calcium Score:

Left main: 0

Left anterior descending artery: 0

Left circumflex artery: 0

Right coronary artery: 0

Total: 0

Percentile: 0

Pericardium: Normal.

Ascending Aorta: Normal caliber.

Non-cardiac: See separate report from [REDACTED].
IMPRESSION: Coronary calcium score of 0.



If CAC=0, it is reasonable to withhold statin therapy and reassess
in 5 to 10 years, as long as higher risk conditions are absent
(diabetes mellitus, family history of premature CHD in first degree
relatives (males <55 years; females <65 years), cigarette smoking,
or LDL >=190 mg/dL).

If CAC is 1 to 99, it is reasonable to initiate statin therapy for
patients >=55 years of age.

If CAC is >=100 or >=75th percentile, it is reasonable to initiate
statin therapy at any age.

Cardiology referral should be considered for patients with CAC
scores >=400 or >=75th percentile.

*1667 AHA/ACC/AACVPR/AAPA/ABC/SAETHER/SILJA NEBOJSA LIMAR/DAMBAJEVA/[place]ijn/SIA/ALICEA/HABEBT
Guideline on the Management of Blood Cholesterol: A Report of the
American College of Cardiology/American Heart Association Task Force
on Clinical Practice Guidelines. J Am Coll Cardiol.
4820;73(24):1182-1424.

*** End of Addendum ***
EXAM:
OVER-READ INTERPRETATION  CT CHEST

The following report is an over-read performed by radiologist Dr.
Mao Guitron [REDACTED] on 07/24/2020. This
over-read does not include interpretation of cardiac or coronary
anatomy or pathology. The coronary calcium score interpretation by
the cardiologist is attached.
FINDINGS: Vascular: The heart size appears top normal. No pericardial fluid
identified.

Mediastinum/Nodes: Visualized mediastinum and hilar regions
demonstrate no lymphadenopathy or masses.

Lungs/Pleura: Visualized lungs show no evidence of pulmonary edema,
consolidation, pneumothorax, nodule or pleural fluid.

Upper Abdomen: No acute abnormality.

Musculoskeletal: No chest wall mass or suspicious bone lesions
identified.
IMPRESSION: No significant incidental findings.  Top-normal heart size.

## 2023-03-11 ENCOUNTER — Telehealth: Payer: Self-pay

## 2023-03-11 NOTE — Telephone Encounter (Signed)
**Note De-Identified Hadrian Yarbrough Obfuscation** Per the [email or web address]  Code: 63875  Description: POLYSOMNOGRAPHY W/CPAP  Comments: Review Not Recommended Confirmation number for your records: [id number]  I called [facility] at [phone] and did a Split Night Sleep Study PA with Orlene Plum T as well and she advised me that a PA is not required. Call reference #: [id number]

## 2023-04-11 ENCOUNTER — Ambulatory Visit (HOSPITAL_BASED_OUTPATIENT_CLINIC_OR_DEPARTMENT_OTHER): Payer: 59 | Attending: Cardiology | Admitting: Cardiovascular Disease

## 2023-04-11 DIAGNOSIS — G4730 Sleep apnea, unspecified: Secondary | ICD-10-CM | POA: Diagnosis not present

## 2023-04-11 DIAGNOSIS — R0683 Snoring: Secondary | ICD-10-CM | POA: Diagnosis present

## 2023-04-24 ENCOUNTER — Encounter (HOSPITAL_BASED_OUTPATIENT_CLINIC_OR_DEPARTMENT_OTHER): Payer: Self-pay | Admitting: Cardiovascular Disease

## 2023-04-24 NOTE — Procedures (Signed)
     Patient Name: Donald Frank, Donald Frank Date: 04/11/2023 Gender: Male D.O.B: 04-30-1960 Age (years): 79 Referring Provider: Lavona Mound Tobb DO Height (inches): 70 Interpreting Physician: Nicki Guadalajara MD, ABSM Weight (lbs): 217 RPSGT: Cherylann Parr BMI: 31 MRN: [id number] Neck Size: 17.00  CLINICAL INFORMATION Sleep Study Type: NPSG  Indication for sleep study: Snoring  Epworth Sleepiness Score: 8  SLEEP STUDY TECHNIQUE As per the AASM Manual for the Scoring of Sleep and Associated Events v2.3 (April 2016) with a hypopnea requiring 4% desaturations.  The channels recorded and monitored were frontal, central and occipital EEG, electrooculogram (EOG), submentalis EMG (chin), nasal and oral airflow, thoracic and abdominal wall motion, anterior tibialis EMG, snore microphone, electrocardiogram, and pulse oximetry.  MEDICATIONS Evolocumab (REPATHA SURECLICK) 140 MG/ML SOAJ icosapent Ethyl (VASCEPA) 1 g capsule rosuvastatin (CRESTOR) 40 MG tablet   Medications self-administered by patient taken the night of the study : N/A  SLEEP ARCHITECTURE The study was initiated at 10:24:34 PM and ended at 4:36:28 AM.  Sleep onset time was 21.7 minutes and the sleep efficiency was 85.4%. The total sleep time was 317.7 minutes.  Stage REM latency was 180.5 minutes.  The patient spent 1.6% of the night in stage N1 sleep, 88.5% in stage N2 sleep, 0.0% in stage N3 and 9.9% in REM.  Alpha intrusion was absent.  Supine sleep was 46.13%.  RESPIRATORY PARAMETERS The overall apnea/hypopnea index (AHI) was 0.2 per hour. The respiratory disturbance index (RDI) is 0.2/h. There were 1 total apneas, including 1 obstructive, 0 central and 0 mixed apneas. There were 0 hypopneas and 0 RERAs.  The AHI during Stage REM sleep was 0.0 per hour.  AHI while supine was 0.4 per hour.  The mean oxygen saturation was 94.4%. The minimum SpO2 during sleep was 91.0%.  Moderate snoring was noted during this  study.  CARDIAC DATA The 2 lead EKG demonstrated sinus rhythm. The mean heart rate was 52.4 beats per minute. Other EKG findings include: None.  LEG MOVEMENT DATA The total PLMS were 0 with a resulting PLMS index of 0.0. Associated arousal with leg movement index was 0.0 .  IMPRESSIONS - No significant obstructive sleep apnea occurred during this study (AHI 0.2/h; RDI 0.2; REM sleep AHI 0). - The patient had no oxygen desaturation during the study (Min O2 91.0%) - The patient snored with moderate snoring volume. - No cardiac abnormalities were noted during this study. - Clinically significant periodic limb movements did not occur during sleep. No significant associated arousals.  DIAGNOSIS - Normal study - Moderate snoring  RECOMMENDATIONS - There is no indication for CPAP or oral appliance therapy. - With moderate snoring, effort should be made to optimize nasal and oropharyngeal patency. - Avoid alcohol, sedatives and other CNS depressants that may worsen sleep apnea and disrupt normal sleep architecture. - Sleep hygiene should be reviewed to assess factors that may improve sleep quality. - Weight management (BMI 31) and regular exercise should be initiated or continued if appropriate.  [Electronically signed] 04/24/2023 05:32 PM  Nicki Guadalajara MD, [facility], ABSM Diplomate, American Board of Sleep Medicine NPI: [id number]  [facility] PH: [phone]   FX: [phone] ACCREDITED BY THE AMERICAN ACADEMY OF SLEEP MEDICINE

## 2023-05-02 ENCOUNTER — Telehealth: Payer: Self-pay

## 2023-05-02 NOTE — Telephone Encounter (Signed)
 Notified patient of sleep study results and recommendations. All questions were answered and patient verbalized understanding.

## 2023-05-02 NOTE — Telephone Encounter (Signed)
-----   Message from Nicki Guadalajara sent at 04/24/2023  5:37 PM EST ----- Eliseo Gum, please notify pt of the results.  Essentially a normal study, snoring present.

## 2023-10-06 ENCOUNTER — Other Ambulatory Visit: Payer: Self-pay | Admitting: Cardiology

## 2023-10-30 ENCOUNTER — Telehealth: Payer: Self-pay | Admitting: Pharmacy Technician

## 2023-10-30 DIAGNOSIS — E782 Mixed hyperlipidemia: Secondary | ICD-10-CM

## 2023-10-30 DIAGNOSIS — E7801 Familial hypercholesterolemia: Secondary | ICD-10-CM

## 2023-10-30 NOTE — Telephone Encounter (Signed)
   Cmm spinning

## 2023-11-03 NOTE — Telephone Encounter (Signed)
 Pharmacy Patient Advocate Encounter  Received notification from [employer] that Prior Authorization for repatha  has been APPROVED from 11/03/23 to 11/02/24

## 2023-11-03 NOTE — Telephone Encounter (Signed)
 Pharmacy Patient Advocate Encounter   Received notification from Onbase that prior authorization for REPATHA  is required/requested.   Insurance verification completed.   The patient is insured through [employer] .   Per test claim: PA required; PA submitted to above mentioned insurance via CoverMyMeds Key/confirmation #/EOC [id number] Status is pending

## 2023-11-12 MED ORDER — REPATHA SURECLICK 140 MG/ML ~~LOC~~ SOAJ
SUBCUTANEOUS | 3 refills | Status: AC
Start: 2023-11-12 — End: ?

## 2023-11-12 NOTE — Addendum Note (Signed)
 Addended by: Tiena Manansala D on: 11/12/2023 08:36 AM   Modules accepted: Orders
